# Patient Record
Sex: Female | Born: 1935 | Race: White | Hispanic: No | Smoking: Never smoker
Health system: Southern US, Community
[De-identification: ages and names within clinical notes are randomized; demographics above are authoritative.]

## PROBLEM LIST (undated history)

## (undated) DIAGNOSIS — G629 Polyneuropathy, unspecified: Secondary | ICD-10-CM

## (undated) DIAGNOSIS — R591 Generalized enlarged lymph nodes: Secondary | ICD-10-CM

## (undated) DIAGNOSIS — I639 Cerebral infarction, unspecified: Secondary | ICD-10-CM

## (undated) DIAGNOSIS — N289 Disorder of kidney and ureter, unspecified: Secondary | ICD-10-CM

## (undated) DIAGNOSIS — E119 Type 2 diabetes mellitus without complications: Secondary | ICD-10-CM

## (undated) DIAGNOSIS — D649 Anemia, unspecified: Secondary | ICD-10-CM

## (undated) DIAGNOSIS — I509 Heart failure, unspecified: Secondary | ICD-10-CM

## (undated) DIAGNOSIS — I2510 Atherosclerotic heart disease of native coronary artery without angina pectoris: Secondary | ICD-10-CM

## (undated) HISTORY — PX: TONSILLECTOMY: SUR1361

## (undated) HISTORY — PX: CHOLECYSTECTOMY: SHX55

## (undated) HISTORY — PX: TOTAL HIP ARTHROPLASTY: SHX124

---

## 2018-08-28 ENCOUNTER — Emergency Department (HOSPITAL_COMMUNITY): Payer: Medicare Other

## 2018-08-28 ENCOUNTER — Other Ambulatory Visit: Payer: Self-pay

## 2018-08-28 ENCOUNTER — Inpatient Hospital Stay (HOSPITAL_COMMUNITY)
Admission: EM | Admit: 2018-08-28 | Discharge: 2018-09-02 | DRG: 603 | Disposition: A | Discharge status: Home Health | Payer: Medicare Other | Attending: Family Medicine | Admitting: Family Medicine

## 2018-08-28 ENCOUNTER — Encounter (HOSPITAL_COMMUNITY): Payer: Self-pay | Admitting: *Deleted

## 2018-08-28 DIAGNOSIS — I890 Lymphedema, not elsewhere classified: Secondary | ICD-10-CM | POA: Diagnosis present

## 2018-08-28 DIAGNOSIS — L538 Other specified erythematous conditions: Secondary | ICD-10-CM | POA: Diagnosis not present

## 2018-08-28 DIAGNOSIS — N183 Chronic kidney disease, stage 3 unspecified: Secondary | ICD-10-CM | POA: Diagnosis present

## 2018-08-28 DIAGNOSIS — A09 Infectious gastroenteritis and colitis, unspecified: Secondary | ICD-10-CM | POA: Diagnosis not present

## 2018-08-28 DIAGNOSIS — Z955 Presence of coronary angioplasty implant and graft: Secondary | ICD-10-CM

## 2018-08-28 DIAGNOSIS — Z91040 Latex allergy status: Secondary | ICD-10-CM

## 2018-08-28 DIAGNOSIS — R609 Edema, unspecified: Secondary | ICD-10-CM

## 2018-08-28 DIAGNOSIS — I130 Hypertensive heart and chronic kidney disease with heart failure and stage 1 through stage 4 chronic kidney disease, or unspecified chronic kidney disease: Secondary | ICD-10-CM | POA: Diagnosis present

## 2018-08-28 DIAGNOSIS — E1122 Type 2 diabetes mellitus with diabetic chronic kidney disease: Secondary | ICD-10-CM | POA: Diagnosis present

## 2018-08-28 DIAGNOSIS — I2510 Atherosclerotic heart disease of native coronary artery without angina pectoris: Secondary | ICD-10-CM | POA: Diagnosis present

## 2018-08-28 DIAGNOSIS — I5032 Chronic diastolic (congestive) heart failure: Secondary | ICD-10-CM | POA: Diagnosis present

## 2018-08-28 DIAGNOSIS — M25462 Effusion, left knee: Secondary | ICD-10-CM

## 2018-08-28 DIAGNOSIS — Z66 Do not resuscitate: Secondary | ICD-10-CM | POA: Diagnosis present

## 2018-08-28 DIAGNOSIS — Z96649 Presence of unspecified artificial hip joint: Secondary | ICD-10-CM | POA: Diagnosis present

## 2018-08-28 DIAGNOSIS — Z8673 Personal history of transient ischemic attack (TIA), and cerebral infarction without residual deficits: Secondary | ICD-10-CM

## 2018-08-28 DIAGNOSIS — M7121 Synovial cyst of popliteal space [Baker], right knee: Secondary | ICD-10-CM | POA: Diagnosis present

## 2018-08-28 DIAGNOSIS — E119 Type 2 diabetes mellitus without complications: Secondary | ICD-10-CM

## 2018-08-28 DIAGNOSIS — H9190 Unspecified hearing loss, unspecified ear: Secondary | ICD-10-CM | POA: Diagnosis present

## 2018-08-28 DIAGNOSIS — A0472 Enterocolitis due to Clostridium difficile, not specified as recurrent: Secondary | ICD-10-CM | POA: Diagnosis present

## 2018-08-28 DIAGNOSIS — Z6835 Body mass index (BMI) 35.0-35.9, adult: Secondary | ICD-10-CM

## 2018-08-28 DIAGNOSIS — I252 Old myocardial infarction: Secondary | ICD-10-CM | POA: Diagnosis not present

## 2018-08-28 DIAGNOSIS — Z9181 History of falling: Secondary | ICD-10-CM

## 2018-08-28 DIAGNOSIS — I872 Venous insufficiency (chronic) (peripheral): Secondary | ICD-10-CM | POA: Diagnosis present

## 2018-08-28 DIAGNOSIS — E785 Hyperlipidemia, unspecified: Secondary | ICD-10-CM | POA: Diagnosis present

## 2018-08-28 DIAGNOSIS — L03115 Cellulitis of right lower limb: Principal | ICD-10-CM | POA: Diagnosis present

## 2018-08-28 DIAGNOSIS — E669 Obesity, unspecified: Secondary | ICD-10-CM | POA: Diagnosis present

## 2018-08-28 DIAGNOSIS — R197 Diarrhea, unspecified: Secondary | ICD-10-CM | POA: Diagnosis present

## 2018-08-28 DIAGNOSIS — Z79899 Other long term (current) drug therapy: Secondary | ICD-10-CM

## 2018-08-28 DIAGNOSIS — R42 Dizziness and giddiness: Secondary | ICD-10-CM | POA: Diagnosis present

## 2018-08-28 DIAGNOSIS — I10 Essential (primary) hypertension: Secondary | ICD-10-CM | POA: Diagnosis not present

## 2018-08-28 DIAGNOSIS — R918 Other nonspecific abnormal finding of lung field: Secondary | ICD-10-CM | POA: Diagnosis present

## 2018-08-28 DIAGNOSIS — Z7902 Long term (current) use of antithrombotics/antiplatelets: Secondary | ICD-10-CM

## 2018-08-28 DIAGNOSIS — Z20828 Contact with and (suspected) exposure to other viral communicable diseases: Secondary | ICD-10-CM | POA: Diagnosis present

## 2018-08-28 DIAGNOSIS — M1712 Unilateral primary osteoarthritis, left knee: Secondary | ICD-10-CM | POA: Diagnosis present

## 2018-08-28 DIAGNOSIS — D638 Anemia in other chronic diseases classified elsewhere: Secondary | ICD-10-CM | POA: Diagnosis present

## 2018-08-28 DIAGNOSIS — Z880 Allergy status to penicillin: Secondary | ICD-10-CM | POA: Diagnosis not present

## 2018-08-28 DIAGNOSIS — R509 Fever, unspecified: Secondary | ICD-10-CM

## 2018-08-28 HISTORY — DX: Type 2 diabetes mellitus without complications: E11.9

## 2018-08-28 HISTORY — DX: Generalized enlarged lymph nodes: R59.1

## 2018-08-28 HISTORY — DX: Heart failure, unspecified: I50.9

## 2018-08-28 HISTORY — DX: Cerebral infarction, unspecified: I63.9

## 2018-08-28 HISTORY — DX: Anemia, unspecified: D64.9

## 2018-08-28 HISTORY — DX: Polyneuropathy, unspecified: G62.9

## 2018-08-28 HISTORY — DX: Atherosclerotic heart disease of native coronary artery without angina pectoris: I25.10

## 2018-08-28 HISTORY — DX: Disorder of kidney and ureter, unspecified: N28.9

## 2018-08-28 LAB — URINALYSIS, ROUTINE W REFLEX MICROSCOPIC
Bilirubin Urine: NEGATIVE
Glucose, UA: NEGATIVE mg/dL
Ketones, ur: NEGATIVE mg/dL
Leukocytes,Ua: NEGATIVE
Nitrite: NEGATIVE
Protein, ur: NEGATIVE mg/dL
Specific Gravity, Urine: 1.005 (ref 1.005–1.030)
pH: 6 (ref 5.0–8.0)

## 2018-08-28 LAB — COMPREHENSIVE METABOLIC PANEL
ALT: 24 U/L (ref 0–44)
AST: 32 U/L (ref 15–41)
Albumin: 2.6 g/dL — ABNORMAL LOW (ref 3.5–5.0)
Alkaline Phosphatase: 59 U/L (ref 38–126)
Anion gap: 6 (ref 5–15)
BUN: 16 mg/dL (ref 8–23)
CO2: 28 mmol/L (ref 22–32)
Calcium: 9.1 mg/dL (ref 8.9–10.3)
Chloride: 105 mmol/L (ref 98–111)
Creatinine, Ser: 1.05 mg/dL — ABNORMAL HIGH (ref 0.44–1.00)
GFR calc Af Amer: 56 mL/min — ABNORMAL LOW (ref >60)
GFR calc non Af Amer: 48 mL/min — ABNORMAL LOW (ref >60)
Glucose, Bld: 115 mg/dL — ABNORMAL HIGH (ref 70–99)
Potassium: 4.6 mmol/L (ref 3.5–5.1)
Sodium: 139 mmol/L (ref 135–145)
Total Bilirubin: 1.3 mg/dL — ABNORMAL HIGH (ref 0.3–1.2)
Total Protein: 5.4 g/dL — ABNORMAL LOW (ref 6.5–8.1)

## 2018-08-28 LAB — CBC
HCT: 31.5 % — ABNORMAL LOW (ref 36.0–46.0)
Hemoglobin: 9.7 g/dL — ABNORMAL LOW (ref 12.0–15.0)
MCH: 30.4 pg (ref 26.0–34.0)
MCHC: 30.8 g/dL (ref 30.0–36.0)
MCV: 98.7 fL (ref 80.0–100.0)
Platelets: 227 10*3/uL (ref 150–400)
RBC: 3.19 MIL/uL — ABNORMAL LOW (ref 3.87–5.11)
RDW: 15.2 % (ref 11.5–15.5)
WBC: 8 10*3/uL (ref 4.0–10.5)
nRBC: 0 % (ref 0.0–0.2)

## 2018-08-28 LAB — LACTIC ACID, PLASMA
Lactic Acid, Venous: 1.1 mmol/L (ref 0.5–1.9)
Lactic Acid, Venous: 1.8 mmol/L (ref 0.5–1.9)

## 2018-08-28 LAB — SARS CORONAVIRUS 2 BY RT PCR (HOSPITAL ORDER, PERFORMED IN ~~LOC~~ HOSPITAL LAB): SARS Coronavirus 2: NEGATIVE

## 2018-08-28 LAB — PROCALCITONIN: Procalcitonin: <0.1 ng/mL

## 2018-08-28 LAB — MAGNESIUM: Magnesium: 1.9 mg/dL (ref 1.7–2.4)

## 2018-08-28 MED ORDER — LACTATED RINGERS IV SOLN
INTRAVENOUS | Status: DC
  Administered 2018-08-28: 20:00:00 via INTRAVENOUS

## 2018-08-28 MED ORDER — ONDANSETRON HCL 4 MG PO TABS: 4.0000 mg | ORAL_TABLET | Freq: Four times a day (QID) | ORAL | Status: DC | PRN

## 2018-08-28 MED ORDER — VANCOMYCIN HCL 10 G IV SOLR
2000.0000 mg | Freq: Once | INTRAVENOUS | Status: AC
  Administered 2018-08-28: 15:00:00 2000 mg via INTRAVENOUS
  Filled 2018-08-28: qty 2000

## 2018-08-28 MED ORDER — ATORVASTATIN CALCIUM 40 MG PO TABS
40.0000 mg | ORAL_TABLET | Freq: Every day | ORAL | Status: DC
  Administered 2018-08-28 – 2018-09-01 (×5): 40 mg via ORAL
  Filled 2018-08-28 (×5): qty 1

## 2018-08-28 MED ORDER — CLOPIDOGREL BISULFATE 75 MG PO TABS
75.0000 mg | ORAL_TABLET | Freq: Every day | ORAL | Status: DC
  Administered 2018-08-29 – 2018-09-02 (×5): 75 mg via ORAL
  Filled 2018-08-28 (×5): qty 1

## 2018-08-28 MED ORDER — ONDANSETRON HCL 4 MG/2ML IJ SOLN: 4.0000 mg | Freq: Four times a day (QID) | INTRAMUSCULAR | Status: DC | PRN

## 2018-08-28 MED ORDER — PANTOPRAZOLE SODIUM 40 MG PO TBEC
40.0000 mg | DELAYED_RELEASE_TABLET | Freq: Every day | ORAL | Status: DC
  Administered 2018-08-29 – 2018-09-02 (×5): 40 mg via ORAL
  Filled 2018-08-28 (×5): qty 1

## 2018-08-28 MED ORDER — LEVOFLOXACIN IN D5W 500 MG/100ML IV SOLN
500.0000 mg | INTRAVENOUS | Status: DC
  Administered 2018-08-29: 22:00:00 500 mg via INTRAVENOUS
  Filled 2018-08-28 (×2): qty 100

## 2018-08-28 MED ORDER — METOPROLOL SUCCINATE ER 25 MG PO TB24
12.5000 mg | ORAL_TABLET | Freq: Every day | ORAL | Status: DC
  Administered 2018-08-29 – 2018-09-02 (×5): 12.5 mg via ORAL
  Filled 2018-08-28 (×5): qty 1

## 2018-08-28 MED ORDER — VANCOMYCIN HCL IN DEXTROSE 750-5 MG/150ML-% IV SOLN
750.0000 mg | INTRAVENOUS | Status: DC
  Administered 2018-08-29: 15:00:00 750 mg via INTRAVENOUS
  Filled 2018-08-28 (×2): qty 150

## 2018-08-28 MED ORDER — ACETAMINOPHEN 650 MG RE SUPP: 650.0000 mg | Freq: Four times a day (QID) | RECTAL | Status: DC | PRN

## 2018-08-28 MED ORDER — SODIUM CHLORIDE 0.9 % IV BOLUS
1000.0000 mL | Freq: Once | INTRAVENOUS | Status: AC
  Administered 2018-08-28: 1000 mL via INTRAVENOUS

## 2018-08-28 MED ORDER — ACETAMINOPHEN 325 MG PO TABS
650.0000 mg | ORAL_TABLET | Freq: Four times a day (QID) | ORAL | Status: DC | PRN
  Administered 2018-08-31 – 2018-09-01 (×3): 650 mg via ORAL
  Filled 2018-08-28 (×2): qty 2

## 2018-08-28 MED ORDER — LEVOFLOXACIN IN D5W 750 MG/150ML IV SOLN
750.0000 mg | Freq: Once | INTRAVENOUS | Status: AC
  Administered 2018-08-28: 13:00:00 750 mg via INTRAVENOUS
  Filled 2018-08-28: qty 150

## 2018-08-28 MED ORDER — HEPARIN SODIUM (PORCINE) 5000 UNIT/ML IJ SOLN
5000.0000 [IU] | Freq: Three times a day (TID) | INTRAMUSCULAR | Status: DC
  Administered 2018-08-28 – 2018-09-02 (×14): 5000 [IU] via SUBCUTANEOUS
  Filled 2018-08-28 (×14): qty 1

## 2018-08-28 MED ORDER — SODIUM CHLORIDE 0.9% FLUSH
3.0000 mL | Freq: Two times a day (BID) | INTRAVENOUS | Status: DC
  Administered 2018-08-29 – 2018-09-02 (×8): 3 mL via INTRAVENOUS

## 2018-08-28 MED ORDER — SODIUM CHLORIDE 0.9 % IV BOLUS
500.0000 mL | Freq: Once | INTRAVENOUS | Status: AC
  Administered 2018-08-28: 500 mL via INTRAVENOUS

## 2018-08-28 NOTE — ED Triage Notes (Signed)
Consult order for IV team placed because of Pt poor IV access. Pt needs blood cultures and lactic drawn. Lab tech difficult stick also.

## 2018-08-28 NOTE — ED Notes (Signed)
Portable cxr at bedside

## 2018-08-28 NOTE — Progress Notes (Signed)
Notified provider of need to order fluid bolus.  ?

## 2018-08-28 NOTE — ED Notes (Signed)
ED TO INPATIENT HANDOFF REPORT  ED Nurse Name and Phone #:  Patty  S Name/Age/Gender Gail Page 85 y.o. female Room/Bed: 023C/023C  Code Status   Code Status: DNR  Home/SNF/Other Home Patient oriented to: self, place, time and situation Is this baseline? Yes   Triage Complete: Triage complete  Chief Complaint Blood clot in right leg  Triage Note Pt had a fall on 08/07/18, was admitted for a week at that time. This morning pt's daughter noticed increased redness and swelling to R leg, concerned for a blood clot. Pt reports yesterday morning leg was blue and very swollen. Redness noted at present .   Family member at bedside reports Pt . Has had  Fevers of 101 and above since Friday.  Pt had also had diarrhea for at least 20 days > 10 times a day per Pt. Family member reported stool is green in color.  Daughter at staff desk to report Pt has not eaten breakfast today and has not taken AM meds.  Consult order for IV team placed because of Pt poor IV access. Pt needs blood cultures and lactic drawn. Lab tech difficult stick also.  Family at desk and was asked to return to room for safety and Pt confidentiality .  Blood cultures drawn by EDP  With out k device.  Pt very difficult  Stick for IV's and blood draw. Blood cultures drawn before Iv anti-Bx  given  [facility]   Allergies Allergies  Allergen Reactions  . Penicillins Anaphylaxis    Did it involve swelling of the face/tongue/throat, SOB, or low BP? Yes Did it involve sudden or severe rash/hives, skin peeling, or any reaction on the inside of your mouth or nose? No Did you need to seek medical attention at a hospital or doctor's office? No When did it last happen? when 85 years old If all above answers are "NO", may proceed with cephalosporin use.   . Latex Other (See Comments)    Fluid coming out  . Pork-Derived Products     Pt doe not eat pork    Level of Care/Admitting  Diagnosis ED Disposition    ED Disposition Condition Comment   Admit  [facility] [[id number][facility]  Level of Care: Telemetry Medical [104]  Covid Evaluation: Confirmed COVID Negative  Diagnosis: Cellulitis of leg, right [[id number]][[id number]]  Admitting Physician: Jonah BlueYATES, JENNIFER [[id number]]  Attending Physician: Jonah BlueYATES, JENNIFER [[id number]]  Estimated length of stay: past midnight tomorrow  Certification:: I certify this patient will need inpatient services for at least 2 midnights  PT Class (Do Not Modify): Inpatient [101]  PT Acc Code (Do Not Modify): Private [1]       B Medical/Surgery History Past Medical History:  Diagnosis Date  . Anemia   . CHF (congestive heart failure) (HCC)   . Coronary artery disease   . Diabetes mellitus without complication (HCC)   . Lymphadenopathy   . Neuropathy   . Renal disorder   . Stroke [facility](HCC)    Past Surgical History:  Procedure Laterality Date  . CHOLECYSTECTOMY    . TONSILLECTOMY    . TOTAL HIP ARTHROPLASTY       A IV Location/Drains/Wounds Patient Lines/Drains/Airways Status   Active Line/Drains/Airways    Name:   Placement date:   Placement time:   Site:   Days:   Peripheral IV 08/28/18 Left Forearm   08/28/18    0827    Forearm   less than 1  Intake/Output Last 24 hours  Intake/Output Summary (Last 24 hours) at 08/28/2018 1533 Last data filed at 08/28/2018 1532 Gross per 24 hour  Intake 1150 ml  Output -  Net 1150 ml    Labs/Imaging Results for orders placed or performed during the hospital encounter of 08/28/18 (from the past 48 hour(s))  SARS Coronavirus 2 [facility] order, Performed in [facility] hospital lab) Nasopharyngeal Nasopharyngeal Swab     Status: None   Collection Time: 08/28/18  8:02 AM   Specimen: Nasopharyngeal Swab  Result Value Ref Range   SARS Coronavirus 2 NEGATIVE NEGATIVE    Comment: (NOTE) If result is NEGATIVE SARS-CoV-2 target nucleic acids are NOT DETECTED. The SARS-CoV-2 RNA is  generally detectable in upper and lower  respiratory specimens during the acute phase of infection. The lowest  concentration of SARS-CoV-2 viral copies this assay can detect is 250  copies / mL. A negative result does not preclude SARS-CoV-2 infection  and should not be used as the sole basis for treatment or other  patient management decisions.  A negative result may occur with  improper specimen collection / handling, submission of specimen other  than nasopharyngeal swab, presence of viral mutation(s) within the  areas targeted by this assay, and inadequate number of viral copies  (<250 copies / mL). A negative result must be combined with clinical  observations, patient history, and epidemiological information. If result is POSITIVE SARS-CoV-2 target nucleic acids are DETECTED. The SARS-CoV-2 RNA is generally detectable in upper and lower  respiratory specimens dur ing the acute phase of infection.  Positive  results are indicative of active infection with SARS-CoV-2.  Clinical  correlation with patient history and other diagnostic information is  necessary to determine patient infection status.  Positive results do  not rule out bacterial infection or co-infection with other viruses. If result is PRESUMPTIVE POSTIVE SARS-CoV-2 nucleic acids MAY BE PRESENT.   A presumptive positive result was obtained on the submitted specimen  and confirmed on repeat testing.  While 2019 novel coronavirus  (SARS-CoV-2) nucleic acids may be present in the submitted sample  additional confirmatory testing may be necessary for epidemiological  and / or clinical management purposes  to differentiate between  SARS-CoV-2 and other Sarbecovirus currently known to infect humans.  If clinically indicated additional testing with an alternate test  methodology [phone]) is advised. The SARS-CoV-2 RNA is generally  detectable in upper and lower respiratory sp ecimens during the acute  phase of  infection. The expected result is Negative. Fact Sheet for Patients:  BoilerBrush.com.cy Fact Sheet for Healthcare Providers: https://pope.com/ This test is not yet approved or cleared by the [place] FDA and has been authorized for detection and/or diagnosis of SARS-CoV-2 by FDA under an Emergency Use Authorization (EUA).  This EUA will remain in effect (meaning this test can be used) for the duration of the COVID-19 declaration under Section 564(b)(1) of the Act, 21 U.S.C. section 360bbb-3(b)(1), unless the authorization is terminated or revoked sooner. Performed at [facility], [address], [place], [place] [address]   Comprehensive metabolic panel     Status: Abnormal   Collection Time: 08/28/18  8:30 AM  Result Value Ref Range   Sodium 139 135 - 145 mmol/L   Potassium 4.6 3.5 - 5.1 mmol/L   Chloride 105 98 - 111 mmol/L   CO2 28 22 - 32 mmol/L   Glucose, Bld 115 (H) 70 - 99 mg/dL   BUN 16 8 - 23 mg/dL  Creatinine, Ser 1.05 (H) 0.44 - 1.00 mg/dL   Calcium 9.1 8.9 - 16.110.3 mg/dL   Total Protein 5.4 (L) 6.5 - 8.1 g/dL   Albumin 2.6 (L) 3.5 - 5.0 g/dL   AST 32 15 - 41 U/L   ALT 24 0 - 44 U/L   Alkaline Phosphatase 59 38 - 126 U/L   Total Bilirubin 1.3 (H) 0.3 - 1.2 mg/dL   GFR calc non Af Amer 48 (L) >60 mL/min   GFR calc Af Amer 56 (L) >60 mL/min   Anion gap 6 5 - 15    Comment: Performed at [facility], [address] [place], [place] [address]  CBC     Status: Abnormal   Collection Time: 08/28/18  8:30 AM  Result Value Ref Range   WBC 8.0 4.0 - 10.5 K/uL   RBC 3.19 (L) 3.87 - 5.11 MIL/uL   Hemoglobin 9.7 (L) 12.0 - 15.0 g/dL   HCT 54.031.5 (L) 98.136.0 - 19.146.0 %   MCV 98.7 80.0 - 100.0 fL   MCH 30.4 26.0 - 34.0 pg   MCHC 30.8 30.0 - 36.0 g/dL   RDW 47.815.2 29.511.5 - 62.115.5 %   Platelets 227 150 - 400 K/uL   nRBC 0.0 0.0 - 0.2 %    Comment: Performed at [facility], [address] [place], [place] [address]   Magnesium     Status: None   Collection Time: 08/28/18  8:30 AM  Result Value Ref Range   Magnesium 1.9 1.7 - 2.4 mg/dL    Comment: Performed at [facility], [address] [place], [place] [address]   Dg Chest Port 1 View  Result Date: 08/28/2018 CLINICAL DATA:  Leg pain and swelling. Fever today. Diarrhea for 20 days. Fall on 08/07/2018. EXAM: PORTABLE CHEST 1 VIEW COMPARISON:  None. FINDINGS: Cardiac silhouette is normal in size. No mediastinal or hilar masses or evidence of adenopathy. There is opacity at the left lung base. Milder opacity is noted at the right lung base which is likely due to a combination of atelectasis and contour lobulation of the hemidiaphragm. Remainder of the lungs show prominent bronchovascular markings, but are otherwise clear. Possible small effusions, particularly on the left. No pneumothorax. Skeletal structures are grossly intact. IMPRESSION: 1. Left lung base opacity consistent with pneumonia or atelectasis. A mass is also possible. Probable small associated pleural effusion. Consider follow-up chest CT with contrast for further assessment. 2. Mild atelectasis or, less likely, infiltrate at the right lung base. 3. Lung hyperexpansion consistent with underlying COPD. Electronically Signed   By: Amie Portlandavid  Ormond M.D.   On: 08/28/2018 10:09   Vas [place] Lower Extremity Venous (dvt)  Result Date: 08/28/2018  Lower Venous Study Indications: Swelling.  Risk Factors: Trauma. Limitations: Body habitus and poor ultrasound/tissue interface. Comparison Study: No prior studies. Performing Technologist: Chanda BusingGregory Collins RVT  Examination Guidelines: A complete evaluation includes B-mode imaging, spectral Doppler, color Doppler, and power Doppler as needed of all accessible portions of each vessel. Bilateral testing is considered an integral part of a complete examination. Limited examinations for reoccurring indications may be performed as noted.   +---------+---------------+---------+-----------+----------+-------------------+ RIGHT    CompressibilityPhasicitySpontaneityPropertiesSummary             +---------+---------------+---------+-----------+----------+-------------------+ CFV      Full           Yes      Yes                                      +---------+---------------+---------+-----------+----------+-------------------+  SFJ      Full                                                             +---------+---------------+---------+-----------+----------+-------------------+ FV Prox  Full                                                             +---------+---------------+---------+-----------+----------+-------------------+ FV Mid                  Yes      Yes                                      +---------+---------------+---------+-----------+----------+-------------------+ FV Distal               Yes      Yes                                      +---------+---------------+---------+-----------+----------+-------------------+ PFV      Full                                                             +---------+---------------+---------+-----------+----------+-------------------+ POP      Full           Yes      Yes                                      +---------+---------------+---------+-----------+----------+-------------------+ PTV      Full                                                             +---------+---------------+---------+-----------+----------+-------------------+ PERO                                                  Patency shown with                                                        color doppler       +---------+---------------+---------+-----------+----------+-------------------+   +----+---------------+---------+-----------+----------+-------+ LEFTCompressibilityPhasicitySpontaneityPropertiesSummary  +----+---------------+---------+-----------+----------+-------+ CFV Full           Yes      Yes                          +----+---------------+---------+-----------+----------+-------+  Summary: Right: There is no evidence of deep vein thrombosis in the lower extremity. A cystic structure is found in the popliteal fossa. A fluid collection is noted in the distal thigh. The fluid collection extends from the distal anterior thigh into the distal posterior thigh. Left: No evidence of common femoral vein obstruction.  *See table(s) above for measurements and observations.    Preliminary     Pending Labs Unresulted Labs (From admission, onward)    Start     Ordered   08/29/18 0500  Basic metabolic panel  Tomorrow morning,   R     08/28/18 1521   08/29/18 0500  CBC  Tomorrow morning,   R     08/28/18 1521   08/28/18 1517  Procalcitonin  Once,   STAT     08/28/18 1521   08/28/18 1143  C difficile quick scan w PCR reflex  (C Difficile quick screen w PCR reflex panel)  Once, for 24 hours,   STAT     08/28/18 1143   08/28/18 1143  Urine culture  ONCE - STAT,   STAT     08/28/18 1143   08/28/18 0920  Blood culture (routine x 2)  BLOOD CULTURE X 2,   STAT     08/28/18 0919   08/28/18 0920  Lactic acid, plasma  Now then every 2 hours,   STAT     08/28/18 0919   08/28/18 0801  Urinalysis, Routine w reflex microscopic- may I&O cath if menses  Once,   STAT     08/28/18 0802          Vitals/Pain Today's Vitals   08/28/18 1200 08/28/18 1404 08/28/18 1513 08/28/18 1529  BP:  (!) 150/76 (!) 122/56   Pulse:  71 73   Resp:  16 20   Temp:      TempSrc:      SpO2:   95%   Weight: 94.6 kg     Height: 5\' 4"  (1.626 m)     PainSc:    0-No pain    Isolation Precautions Enteric precautions (UV disinfection)  Medications Medications  vancomycin (VANCOCIN) IVPB 750 mg/150 ml premix (has no administration in time range)  levofloxacin (LEVAQUIN) IVPB 500 mg (has no administration in time  range)  atorvastatin (LIPITOR) tablet 40 mg (has no administration in time range)  metoprolol succinate (TOPROL-XL) 24 hr tablet 12.5 mg (has no administration in time range)  pantoprazole (PROTONIX) EC tablet 40 mg (has no administration in time range)  clopidogrel (PLAVIX) tablet 75 mg (has no administration in time range)  sodium chloride flush (NS) 0.9 % injection 3 mL (has no administration in time range)  lactated ringers infusion (has no administration in time range)  acetaminophen (TYLENOL) tablet 650 mg (has no administration in time range)    Or  acetaminophen (TYLENOL) suppository 650 mg (has no administration in time range)  ondansetron (ZOFRAN) tablet 4 mg (has no administration in time range)    Or  ondansetron (ZOFRAN) injection 4 mg (has no administration in time range)  heparin injection 5,000 Units (has no administration in time range)  sodium chloride 0.9 % bolus 500 mL (0 mLs Intravenous Stopped 08/28/18 1524)  sodium chloride 0.9 % bolus 1,000 mL (1,000 mLs Intravenous New Bag/Given 08/28/18 1528)  vancomycin (VANCOCIN) 2,000 mg in sodium chloride 0.9 % 500 mL IVPB (0 mg Intravenous Stopped 08/28/18 1532)  levofloxacin (LEVAQUIN) IVPB 750 mg (0 mg Intravenous Stopped 08/28/18 1433)    Mobility  walks with device Moderate fall risk   Focused Assessments    R Recommendations: See Admitting Provider Note  Report given to:   Additional Notes:

## 2018-08-28 NOTE — Progress Notes (Signed)
Pharmacy Antibiotic Note  Gail Page is a 85 y.o. female admitted on 08/28/2018 with RLE cellulitis.  Patient reports malodorous diarrhea. Pharmacy has been consulted for vancomycin and levfloxacin dosing. Patient has a PCN allergy of unknown reaction. Pharmacy consulted to evaluate PCN allergy. Unable to confirm allergy in CHL. Spoke with patient's daughter who could not confirm reaction to PCN and patient was a poor historian. Would recommend continuing with levofloxacin at this time and consider a PCN skin test in the future. Afebrile. WBC WNL. Hypotensive. CrCl 42. Patient received one time dose of levofloxacin 750mg  and vancomycin 2000mg  in the ED.   Vancomycin 750 mg IV Q 24 hrs. Goal AUC 400-550. Expected AUC: 491.6 Vd used: 0.5 L/kg SCr used: 1.05   Plan: Start levofloxacin 500mg  IV q24h on 8/3 at 2200 Start vancomycin 750mg  IV q24h on 8/3 at 1430 Monitor serum creatinine, C/S and clinical progression Follow up c. difficile PCR   Height: 5\' 4"  (162.6 cm) Weight: 208 lb 8.9 oz (94.6 kg) IBW/kg (Calculated) : 54.7  Temp (24hrs), Avg:98.5 F (36.9 C), Min:98.5 F (36.9 C), Max:98.5 F (36.9 C)  Recent Labs  Lab 08/28/18 0830  WBC 8.0  CREATININE 1.05*    Estimated Creatinine Clearance: 42.9 mL/min (A) (by C-G formula based on SCr of 1.05 mg/dL (H)).    Allergies  Allergen Reactions  . Latex   . Penicillins     Antimicrobials this admission: Vancomyicn 8/2 >>  Levofloxacin 8/2 >>    Microbiology results: 8/2 BCx: sent 8/2 c diff: sent 8/2 UCx: sent 8/2 covid: negative   Thank you for allowing pharmacy to be a part of this patient's care.  Cristela Felt, PharmD PGY1 Pharmacy Resident [employer]: [phone]  08/28/2018 12:49 PM

## 2018-08-28 NOTE — H&P (Signed)
History and Physical    Gail CitizenYvonne Zabriskie ZOX:[id number] DOB: 04/02/1930 DOA: 08/28/2018  PCP: Venancio PoissonMagan - [place], [place] Consultants:  Earnie LarssonGarimella - cardiology Patient coming from:  Home - [place] in [place] but staying with her daughter since her recent hospitalization; NOK: Daughter, [phone]  Chief Complaint: leg problem  HPI: Gail Page is a 85 y.o. female with medical history significant of CAD (2017); hypertension, Type2 Diabetes, hyperlipidemia , chronic renal insufficiency degenerative joint disease, history of CVA, renal insufficiency presenting with fever.  She was previously admitted after a fall 7/12, discharged on 7/17.  Developed fever with T max 101.5 on 7/19, last fever was on Friday and was 99.7.  Her leg was swollen bruised from the fall and she has chronic B L > R LE lymphedema.  The last 2 days, she has bene complaining of RLE lateral lower pain.  The swelling in her feet would not go down last night and was still swollen this AM.  Daughter was concerned about a DVT vs. Cellulitis.  BP has been well controlled.  The diarrhea started during her last hospitalization.  They saw her PCP and was tested for COVID and placed on a BRAT diet.  She started back on a regular diet.  HHN mentioned concern for C diff - malodorous, green, mucus; has not been tested.  On Lasix when she gains ?2 pounds in a day, none recently.  Has had some vertigo, fatigue.   ED Course:  [facility] for [facility]/weakness.  Xrays negative, no surgery, marked bruising.  2 days of worsening swelling, erythema, pain of RLE.  +weakness.  +fever, T max 101.5 on 7/21, last fever on 7/31.  +diarrhea since last hospitalization - did get abx while hospitalized, has not had stool studies.  +cellulitis.  BP 90/40s, given 500 cc without significant improvement and so ordered 1L.  Labs unremarkable.  Given Vanc/Levaquin for cellulitis coverage.  LE dopplers pending but less suspicion for DVT.  COVID negative.  Review of Systems:  As per HPI; otherwise review of systems reviewed and negative.   Ambulatory Status:  Ambulates with a walker  Past Medical History:  Diagnosis Date  . Anemia   . CHF (congestive heart failure) (HCC)   . Coronary artery disease   . Diabetes mellitus without complication (HCC)   . Lymphadenopathy   . Neuropathy   . Renal disorder   . Stroke [facility](HCC)     Past Surgical History:  Procedure Laterality Date  . CHOLECYSTECTOMY    . TONSILLECTOMY    . TOTAL HIP ARTHROPLASTY      Social History   Socioeconomic History  . Marital status: Divorced    Spouse name: Not on file  . Number of children: Not on file  . Years of education: Not on file  . Highest education level: Not on file  Occupational History  . Occupation: retired  Engineer, productionocial Needs  . Financial resource strain: Not on file  . Food insecurity    Worry: Not on file    Inability: Not on file  . Transportation needs    Medical: Not on file    Non-medical: Not on file  Tobacco Use  . Smoking status: Never Smoker  . Smokeless tobacco: Never Used  Substance and Sexual Activity  . Alcohol use: Not Currently    Frequency: Never    Comment: rare  . Drug use: Never  . Sexual activity: Not on file  Lifestyle  . Physical activity    Days  per week: Not on file    Minutes per session: Not on file  . Stress: Not on file  Relationships  . Social Musician on phone: Not on file    Gets together: Not on file    Attends religious service: Not on file    Active member of club or organization: Not on file    Attends meetings of clubs or organizations: Not on file    Relationship status: Not on file  . Intimate partner violence    Fear of current or ex partner: Not on file    Emotionally abused: Not on file    Physically abused: Not on file    Forced sexual activity: Not on file  Other Topics Concern  . Not on file  Social History Narrative  . Not on file    Allergies  Allergen Reactions  . Penicillins  Anaphylaxis    Did it involve swelling of the face/tongue/throat, SOB, or low BP? Yes Did it involve sudden or severe rash/hives, skin peeling, or any reaction on the inside of your mouth or nose? No Did you need to seek medical attention at a hospital or doctor's office? No When did it last happen? when 85 years old If all above answers are "NO", may proceed with cephalosporin use.   . Latex Other (See Comments)    Fluid coming out  . Pork-Derived Products     Pt does not eat pork. I spoke with patient - she is ok with heparin.    No family history on file.  Prior to Admission medications   Not on File    Physical Exam: Vitals:   08/28/18 1404 08/28/18 1513 08/28/18 1515 08/28/18 1530  BP: (!) 150/76 (!) 122/56 (!) 118/55 (!) 129/54  Pulse: 71 73 75 76  Resp: 16 20 (!) 21 (!) 23  Temp:      TempSrc:      SpO2:  95% 96% 100%  Weight:      Height:         . General:  Appears calm and comfortable and is NAD . Eyes:  PERRL, EOMI, normal lids, iris . ENT:  Hard of hearing, normal lips & tongue, mildly dry mm . Neck:  no LAD, masses or thyromegaly . Cardiovascular:  RRR, no m/r/g. No LE edema.  Marland Kitchen Respiratory:   CTA bilaterally with no wheezes/rales/rhonchi.  Normal respiratory effort. . Abdomen:  soft, NT, ND, NABS . Skin:  Chronic B LE lymphedema but the entire RLE from knee to foot is diffusely erythematous with worsening edema compared to prior.  There may be mild streaking medially above the knee.  No obvious skin wounds.     . Musculoskeletal:  grossly normal tone BUE/BLE, good ROM, no bony abnormality . Lower extremity:  No LE edema.  Limited foot exam with no ulcerations.  2+ distal pulses. Marland Kitchen Psychiatric:  grossly normal mood and affect, speech fluent and appropriate, AOx3 . Neurologic:  CN 2-12 grossly intact, moves all extremities in coordinated fashion    Radiological Exams on Admission: Dg Chest Port 1 View  Result Date: 08/28/2018 CLINICAL DATA:   Leg pain and swelling. Fever today. Diarrhea for 20 days. Fall on 08/07/2018. EXAM: PORTABLE CHEST 1 VIEW COMPARISON:  None. FINDINGS: Cardiac silhouette is normal in size. No mediastinal or hilar masses or evidence of adenopathy. There is opacity at the left lung base. Milder opacity is noted at the right lung base which is likely due  to a combination of atelectasis and contour lobulation of the hemidiaphragm. Remainder of the lungs show prominent bronchovascular markings, but are otherwise clear. Possible small effusions, particularly on the left. No pneumothorax. Skeletal structures are grossly intact. IMPRESSION: 1. Left lung base opacity consistent with pneumonia or atelectasis. A mass is also possible. Probable small associated pleural effusion. Consider follow-up chest CT with contrast for further assessment. 2. Mild atelectasis or, less likely, infiltrate at the right lung base. 3. Lung hyperexpansion consistent with underlying COPD. Electronically Signed   By: Amie Portlandavid  Ormond M.D.   On: 08/28/2018 10:09   Vas Koreas Lower Extremity Venous (dvt)  Result Date: 08/28/2018  Lower Venous Study Indications: Swelling.  Risk Factors: Trauma. Limitations: Body habitus and poor ultrasound/tissue interface. Comparison Study: No prior studies. Performing Technologist: Chanda BusingGregory Collins RVT  Examination Guidelines: A complete evaluation includes B-mode imaging, spectral Doppler, color Doppler, and power Doppler as needed of all accessible portions of each vessel. Bilateral testing is considered an integral part of a complete examination. Limited examinations for reoccurring indications may be performed as noted.  +---------+---------------+---------+-----------+----------+-------------------+ RIGHT    CompressibilityPhasicitySpontaneityPropertiesSummary             +---------+---------------+---------+-----------+----------+-------------------+ CFV      Full           Yes      Yes                                       +---------+---------------+---------+-----------+----------+-------------------+ SFJ      Full                                                             +---------+---------------+---------+-----------+----------+-------------------+ FV Prox  Full                                                             +---------+---------------+---------+-----------+----------+-------------------+ FV Mid                  Yes      Yes                                      +---------+---------------+---------+-----------+----------+-------------------+ FV Distal               Yes      Yes                                      +---------+---------------+---------+-----------+----------+-------------------+ PFV      Full                                                             +---------+---------------+---------+-----------+----------+-------------------+ POP  Full           Yes      Yes                                      +---------+---------------+---------+-----------+----------+-------------------+ PTV      Full                                                             +---------+---------------+---------+-----------+----------+-------------------+ PERO                                                  Patency shown with                                                        color doppler       +---------+---------------+---------+-----------+----------+-------------------+   +----+---------------+---------+-----------+----------+-------+ LEFTCompressibilityPhasicitySpontaneityPropertiesSummary +----+---------------+---------+-----------+----------+-------+ CFV Full           Yes      Yes                          +----+---------------+---------+-----------+----------+-------+     Summary: Right: There is no evidence of deep vein thrombosis in the lower extremity. A cystic structure is found in the popliteal fossa. A fluid collection is  noted in the distal thigh. The fluid collection extends from the distal anterior thigh into the distal posterior thigh. Left: No evidence of common femoral vein obstruction.  *See table(s) above for measurements and observations.    Preliminary     EKG: Independently reviewed.  NSR with rate 70; nonspecific ST changes with no evidence of acute ischemia   Labs on Admission: I have personally reviewed the available labs and imaging studies at the time of the admission.  Pertinent labs:   Glucose 115 BUN 16/Creatinine 1.05/GFR 48 Albumin 2.6 Bili 1.3 WBC 8.0 Hgb 9.7 COVID negative UA with small Hgb, rare bacteria Lactate 1.1, 1.8  Assessment/Plan Principal Problem:   Cellulitis of leg, right Active Problems:   Diarrhea   Opacity of lung on imaging study   Anemia of chronic disease   Chronic diastolic CHF (congestive heart failure) (HCC)   Essential hypertension   CAD (coronary artery disease)   Diabetes mellitus type 2, diet-controlled (HCC)   CKD (chronic kidney disease) stage 3, GFR 30-59 ml/min (HCC)   Cellulitis -Patient with h/o recent fall with significant ecchymosis to RLE, now presenting with erythema to the R lower leg -Careful inspection of the skin does not show skin breaks -Normal WBC count -She was given Vanc and Levaquin in the ER; will continue for now given this plus CXR findings plus anaphylactic PCN allergy -She did have a RLE [place] for DVT and this was negative for DVT; there is an apparent fluid collection in the R thigh, but her symptoms are more concerning in the lower leg and EDP did not feel further imaging  is indicated -For now, will use heparin for DVT prophylaxis and monitor, but if concerns develop she should have evaluation for R thigh hematoma  Diarrhea -Patient with basically all mucus and no stool prior to my evaluation -Daughter reports green, foul-smelling diarrhea since prior hospitalization and reports that she did receive antibiotics at that  time -C diff was ordered by the EDP  LLL Opacity on CXR -She doesn't really have respiratory symptoms or findings on exam -However, CXR performed and shows LLL opacity -She is already on antibiotic coverage because of her cellulitis -Procalcitonin is pending -Consider CT based on clinical course either inpatient or outpatient  Anemia -Baseline 10-11, currently 9.7 -Appears to be stable at this time -Resume FeSO4 at time of d/c  Chronic diastolic CHF -Appears to be compensated at this time, but this may be challenging to predict given her chronic LE lymphedema -No apparent edema on CXR -Continue Plavix, Lipitor, Toprol XL  DM -Diet controlled  Stage 3 CKD -Creatinine at time of last hospital d/c was 1.08 -Currently 1.05 -Will follow  Other -There was discussion about palliative care consultation during her prior hospitalization, as patient has been in declining health Note: This patient has been tested and is negative for the novel coronavirus COVID-19.  DVT prophylaxis: Heparin Code Status:  DNR - confirmed with patient/family Family Communication: Daughter was present throughout evaluation Disposition Plan:  Home once clinically improved Consults called: None  Admission status: Admit - It is my clinical opinion that admission to INPATIENT is reasonable and necessary because of the expectation that this patient will require hospital care that crosses at least 2 midnights to treat this condition based on the medical complexity of the problems presented.  Given the aforementioned information, the predictability of an adverse outcome is felt to be significant.    Jonah BlueJennifer Haizel Gatchell MD [facility]   How to contact the [facility] Attending or Consulting provider 7A - 7P or covering provider during after hours 7P -7A, for this patient?  1. Check the care team in [facility] and look for a) attending/consulting [facility] provider listed and b) the [facility] team listed 2. Log into [email or web address] and  use [facility] universal password to access. If you do not have the password, please contact the hospital operator. 3. Locate the [facility] provider you are looking for under [facility] and page to a number that you can be directly reached. 4. If you still have difficulty reaching the provider, please page the [facility] (Director on Call) for the Hospitalists listed on amion for assistance.   08/28/2018, 6:03 PM

## 2018-08-28 NOTE — ED Triage Notes (Signed)
Family at desk and was asked to return to room for safety and Pt confidentiality .

## 2018-08-28 NOTE — Progress Notes (Signed)
Secure chat with Dr. Billy Fischer regarding need for fluids based off of the sepsis protocol. She stated that she does not feel that giving more fluids would be appropriate at this time. Will continue to monitor protocol for the six hours.

## 2018-08-28 NOTE — Progress Notes (Signed)
Right lower extremity venous duplex has been completed. Preliminary results can be found in CV Proc through chart review.  Results were given to Dr. Billy Fischer.  08/28/18 2:41 PM Gail Page RVT

## 2018-08-28 NOTE — ED Triage Notes (Signed)
Family member at bedside reports Pt . Has had  Fevers of 101 and above since Friday.  Pt had also had diarrhea for at least 20 days > 10 times a day per Pt. Family member reported stool is green in color.

## 2018-08-28 NOTE — ED Triage Notes (Signed)
Pt had a fall on 08/07/18, was admitted for a week at that time. This morning pt's daughter noticed increased redness and swelling to R leg, concerned for a blood clot. Pt reports yesterday morning leg was blue and very swollen. Redness noted at present .

## 2018-08-28 NOTE — ED Triage Notes (Signed)
Blood cultures drawn by EDP  With out k device.  Pt very difficult  Stick for IV's and blood draw. Blood cultures drawn before Iv anti-Bx  given

## 2018-08-28 NOTE — ED Triage Notes (Signed)
Daughter at staff desk to report Pt has not eaten breakfast today and has not taken AM meds.

## 2018-08-28 NOTE — ED Triage Notes (Signed)
PT up to [facility] with assistance.

## 2018-08-28 NOTE — ED Provider Notes (Addendum)
[facility] Provider Note   CSN: [id number] Arrival date & time: 08/28/18  [id number]    History   Chief Complaint Chief Complaint  Patient presents with  . Leg Pain    HPI Lynford CitizenYvonne Page is a 85 y.o. female, Hx Diastolic CHF, DM, Lymphadema, HTN, CAD hx MI 2017, CKD III, CVA (slef-reported), OA, MVP.  She presents with complaints of RLE edema, redness, and pain, worsening in the past 2 days.  Her daughter is present and provides most of history as patient is hard of hearing.  She was [facility] s/p fall and weakness, during which she fell on her right leg, and had a large amount of ecchymoses.  In the last two days, she has had worsening swelling and erythema in her right lower extremity.  No chest pain, has had some DOE that has been stable since discharge from hospital. Rates RLE pain 4/10.  [facility], no fractures or surgeries during hospitalization.  Did received 2 blood transfusions during hospitalization.  Also notes green watery, malodorous diarrhea since discharge from hospital.  Fevers since hospitalization as well, with Tmax 101.5 on 7/21, most recent fever was 7/31.  Endorses occasional abdominal cramping.  [facility]  Tried BRAT diet without improvement. Daughter reports PCP did not seem concerned about diarrhea and did not perform stool studies.  Overall, in last few weeks, has been feeling weak and dizzy with standing.  Daughter decreased her home Metoprolol from 25 to 12.5 and has not been giving her prn Lasix.   Past Medical History:  Diagnosis Date  . Anemia   . CHF (congestive heart failure) (HCC)   . Coronary artery disease   . Diabetes mellitus without complication (HCC)   . Lymphadenopathy   . Neuropathy   . Renal disorder   . Stroke [facility](HCC)     There are no active problems to display for this patient.   Past Surgical History:   Procedure Laterality Date  . CHOLECYSTECTOMY    . TONSILLECTOMY    . TOTAL HIP ARTHROPLASTY       OB History   No obstetric history on file.      Home Medications    Prior to Admission medications   Not on File   Atorvastain 40mg  QHS Calcium 500 Vit D3 1000  plavix 75 Iron (now holding) flonase prn Lasix 20mg  prn (2 lbs in 1 day) Metoprolol XL 25mg  QHS (now 12.5) Nitro prn Vitamin B  Family History No family history on file.  Social History Social History   Tobacco Use  . Smoking status: Never Smoker  . Smokeless tobacco: Never Used  Substance Use Topics  . Alcohol use: Not on file  . Drug use: Never     Allergies   Latex and Penicillins Pork Products  Review of Systems Review of Systems  Constitutional: Positive for activity change, fatigue and fever. Negative for appetite change.  HENT: Negative for rhinorrhea and sore throat.   Eyes: Negative for visual disturbance.  Respiratory: Positive for shortness of breath (on exertion).   Cardiovascular: Positive for leg swelling. Negative for chest pain and palpitations.  Gastrointestinal: Positive for abdominal pain (cramping) and diarrhea. Negative for blood in stool and vomiting.  Genitourinary: Negative for dysuria.  Musculoskeletal: Positive for arthralgias (chronic arthritis).  Skin:       Erythema of RLE  Neurological: Positive for dizziness. Negative for numbness.  Physical Exam Updated Vital Signs BP (!) 94/54   Pulse 68   Temp 98.5 F (36.9 C) (Oral)   Resp (!) 27   Ht 5\' 4"  (1.626 m)   Wt 94.6 kg   SpO2 94%   BMI 35.80 kg/m   Physical Exam Constitutional:      General: She is not in acute distress.    Appearance: She is obese.  HENT:     Head: Normocephalic and atraumatic.  Cardiovascular:     Rate and Rhythm: Normal rate and regular rhythm.     Pulses: Normal pulses.  Pulmonary:     Effort: Pulmonary effort is normal. No respiratory distress.     Breath sounds: Normal  breath sounds. No wheezing, rhonchi or rales.  Abdominal:     General: Bowel sounds are normal. There is no distension.     Palpations: Abdomen is soft.     Tenderness: There is abdominal tenderness (mild diffuse). There is no guarding or rebound.  Musculoskeletal:        General: Swelling (effusion noted caudad to right knee) present.     Right lower leg: Edema (1-2+ pitting edema) present.     Left lower leg: No edema.  Skin:    General: Skin is warm and dry.     Findings: Erythema (RLE) present. No lesion.     Comments: RLE with increased warmth  Neurological:     Mental Status: She is alert and oriented to person, place, and time.     Sensory: No sensory deficit.  Psychiatric:        Mood and Affect: Mood normal.        Behavior: Behavior normal.        ED Treatments / Results  Labs (all labs ordered are listed, but only abnormal results are displayed) Labs Reviewed  COMPREHENSIVE METABOLIC PANEL - Abnormal; Notable for the following components:      Result Value   Glucose, Bld 115 (*)    Creatinine, Ser 1.05 (*)    Total Protein 5.4 (*)    Albumin 2.6 (*)    Total Bilirubin 1.3 (*)    GFR calc non Af Amer 48 (*)    GFR calc Af Amer 56 (*)    All other components within normal limits  CBC - Abnormal; Notable for the following components:   RBC 3.19 (*)    Hemoglobin 9.7 (*)    HCT 31.5 (*)    All other components within normal limits  SARS CORONAVIRUS 2 (HOSPITAL ORDER, [place] [facility])  CULTURE, BLOOD (ROUTINE X 2)  CULTURE, BLOOD (ROUTINE X 2)  C DIFFICILE QUICK SCREEN W PCR REFLEX  URINE CULTURE  MAGNESIUM  URINALYSIS, ROUTINE W REFLEX MICROSCOPIC  LACTIC ACID, PLASMA  LACTIC ACID, PLASMA    EKG None  Radiology Dg Chest Port 1 View  Result Date: 08/28/2018 CLINICAL DATA:  Leg pain and swelling. Fever today. Diarrhea for 20 days. Fall on 08/07/2018. EXAM: PORTABLE CHEST 1 VIEW COMPARISON:  None. FINDINGS: Cardiac silhouette is  normal in size. No mediastinal or hilar masses or evidence of adenopathy. There is opacity at the left lung base. Milder opacity is noted at the right lung base which is likely due to a combination of atelectasis and contour lobulation of the hemidiaphragm. Remainder of the lungs show prominent bronchovascular markings, but are otherwise clear. Possible small effusions, particularly on the left. No pneumothorax. Skeletal structures are grossly intact. IMPRESSION: 1. Left lung base  opacity consistent with pneumonia or atelectasis. A mass is also possible. Probable small associated pleural effusion. Consider follow-up chest CT with contrast for further assessment. 2. Mild atelectasis or, less likely, infiltrate at the right lung base. 3. Lung hyperexpansion consistent with underlying COPD. Electronically Signed   By: Amie Portlandavid  Ormond M.D.   On: 08/28/2018 10:09    Procedures Procedures (including critical care time)  Medications Ordered in ED Medications  sodium chloride 0.9 % bolus 1,000 mL (has no administration in time range)  vancomycin (VANCOCIN) 2,000 mg in sodium chloride 0.9 % 500 mL IVPB (has no administration in time range)  levofloxacin (LEVAQUIN) IVPB 750 mg (750 mg Intravenous New Bag/Given 08/28/18 1303)  sodium chloride 0.9 % bolus 500 mL (500 mLs Intravenous New Bag/Given 08/28/18 1054)     Initial Impression / Assessment and Plan / ED Course  I have reviewed the triage vital signs and the nursing notes.  Pertinent labs & imaging results that were available during my care of the patient were reviewed by me and considered in my medical decision making (see chart for details).   85 yo female with history of DM, CAD, HTN, CKD, who presents with RLE erythema, edema, and fevers.  Concern for RLE DVT given RLE swelling and erythema in the setting of recent hospitalization and decreased activity, could also consider RLE cellulitis if dopplers negative.  No CP or SOB, with stable vitals,  therefore doubt PE.  Will obtain dopplers to rule out.  Fever of unknown source with some diarrhea, doubt C diff.  Will obtain CMP, CBC, Mag, UA, CXR, blood cultures, lactic acid to assess for source of infection.  BMP with Creatinine at baseline per chart review.  Anemia with Hgb at 9.7.  COVID negative.  CXR reviewed with possible LLL opacity.  BP has been soft, currently 98/45.  Order 500cc NS bolus and continue to monitor.  Patient and daughter updated on results and current plan.    C diff ordered.  BP remains low, 94/54.  Will add 1L NS bolus.  Given increased warmth and erythema of RLE in the setting of fevers will start broad spectrum antibiotics for cellulitis.  She remains afebrile with stable HR, intermittent tachypnea.  Planning for US guided IV insertion to obtain lactic acid and blood cultures.  Lactic acid and blood cultures obtained.  Patient to have in and out cath for UA/Cx.  Will consult hospitalist for admission given patient's history of fever, mild hypotension, and likely cellulitis.  LE dopplers not yet completed.  Spoke with Dr. Ophelia CharterYates of [facility] who agreed to accept patient for admission and proceed with further treatment and follow up.    Final Clinical Impressions(s) / ED Diagnoses   Final diagnoses:  Fever  Cellulitis of right lower extremity    ED Discharge Orders    None       Unknown JimMeccariello, Bailey J, DO 08/28/18 1315    Meccariello, Solmon IceBailey J, DO 08/28/18 1338    Alvira MondaySchlossman, Erin, MD 08/29/18 1056

## 2018-08-28 NOTE — ED Notes (Signed)
IV team refused to draw labs.  Phlebotomy states pt was a hard stick and they are unable to collect BC and Lactic.   EDP aware.

## 2018-08-29 LAB — BASIC METABOLIC PANEL
Anion gap: 4 — ABNORMAL LOW (ref 5–15)
BUN: 14 mg/dL (ref 8–23)
CO2: 27 mmol/L (ref 22–32)
Calcium: 8.3 mg/dL — ABNORMAL LOW (ref 8.9–10.3)
Chloride: 110 mmol/L (ref 98–111)
Creatinine, Ser: 1.04 mg/dL — ABNORMAL HIGH (ref 0.44–1.00)
GFR calc Af Amer: 56 mL/min — ABNORMAL LOW (ref >60)
GFR calc non Af Amer: 49 mL/min — ABNORMAL LOW (ref >60)
Glucose, Bld: 100 mg/dL — ABNORMAL HIGH (ref 70–99)
Potassium: 4.5 mmol/L (ref 3.5–5.1)
Sodium: 141 mmol/L (ref 135–145)

## 2018-08-29 LAB — BLOOD CULTURE ID PANEL (REFLEXED)

## 2018-08-29 LAB — CBC
HCT: 26.5 % — ABNORMAL LOW (ref 36.0–46.0)
Hemoglobin: 8.4 g/dL — ABNORMAL LOW (ref 12.0–15.0)
MCH: 30.3 pg (ref 26.0–34.0)
MCHC: 31.7 g/dL (ref 30.0–36.0)
MCV: 95.7 fL (ref 80.0–100.0)
Platelets: 204 10*3/uL (ref 150–400)
RBC: 2.77 MIL/uL — ABNORMAL LOW (ref 3.87–5.11)
RDW: 15 % (ref 11.5–15.5)
WBC: 7.9 10*3/uL (ref 4.0–10.5)
nRBC: 0 % (ref 0.0–0.2)

## 2018-08-29 LAB — C DIFFICILE QUICK SCREEN W PCR REFLEX
C Diff antigen: POSITIVE — AB
C Diff interpretation: DETECTED
C Diff toxin: POSITIVE — AB

## 2018-08-29 NOTE — Progress Notes (Signed)
Pt was switched to low bed with fall matts due to being a high fall risk pt per qualifications of being greater than 83 years old and having a fall recently. Daughter requested for pt to be switched back to standard hospital bed due to frequency that alarm goes off - RN changed setting to  Lowest intensity. Daughter still requested bed change despite hospital policy. RN notified her that she would discuss request with director of floor. RN spoke to [employer]. She verbalized that she will follow up and speak with daughter. In mean time, pt will stay on low bed until further corrected. RN will continue to monitor pt.

## 2018-08-29 NOTE — Progress Notes (Signed)
PHARMACY - PHYSICIAN COMMUNICATION CRITICAL VALUE ALERT - BLOOD CULTURE IDENTIFICATION (BCID)  Tyresha Fede is an 85 y.o. female who presented to [facility] on 08/28/2018 with a chief complaint of right lower extremity cellulitis.   Assessment:  85 year old female admitted with right lower extremity cellulitis. LLL opacity on chest x ray. Currently being treated with vancomycin and levaquin due to a penicillin allergy that was unable to be clarified. Now with coag negative staph in 1/3 blood cultures. This likely represents contamination.   Name of physician (or Provider) Contacted: D[place]al  Current antibiotics: Vanc/Levaquin  Changes to prescribed antibiotics recommended:  None based on this culture Consider D/C levofloxacin if no concern for pneumonia   Results for orders placed or performed during the hospital encounter of 08/28/18  Blood Culture ID Panel (Reflexed) (Collected: 08/28/2018 12:33 PM)  Result Value Ref Range   Enterococcus species NOT DETECTED NOT DETECTED   Listeria monocytogenes NOT DETECTED NOT DETECTED   Staphylococcus species DETECTED (A) NOT DETECTED   Staphylococcus aureus (BCID) NOT DETECTED NOT DETECTED   Methicillin resistance NOT DETECTED NOT DETECTED   Streptococcus species NOT DETECTED NOT DETECTED   Streptococcus agalactiae NOT DETECTED NOT DETECTED   Streptococcus pneumoniae NOT DETECTED NOT DETECTED   Streptococcus pyogenes NOT DETECTED NOT DETECTED   Acinetobacter baumannii NOT DETECTED NOT DETECTED   Enterobacteriaceae species NOT DETECTED NOT DETECTED   Enterobacter cloacae complex NOT DETECTED NOT DETECTED   Escherichia coli NOT DETECTED NOT DETECTED   Klebsiella oxytoca NOT DETECTED NOT DETECTED   Klebsiella pneumoniae NOT DETECTED NOT DETECTED   Proteus species NOT DETECTED NOT DETECTED   Serratia marcescens NOT DETECTED NOT DETECTED   Haemophilus influenzae NOT DETECTED NOT DETECTED   Neisseria meningitidis NOT DETECTED NOT DETECTED   Pseudomonas aeruginosa NOT DETECTED NOT DETECTED   Candida albicans NOT DETECTED NOT DETECTED   Candida glabrata NOT DETECTED NOT DETECTED   Candida krusei NOT DETECTED NOT DETECTED   Candida parapsilosis NOT DETECTED NOT DETECTED   Candida tropicalis NOT DETECTED NOT DETECTED    Jimmy Footman, PharmD, BCPS, BCIDP Infectious Diseases Clinical Pharmacist Phone: [phone] 08/29/2018  11:34 AM

## 2018-08-29 NOTE — Plan of Care (Signed)

## 2018-08-29 NOTE — Progress Notes (Signed)
PROGRESS NOTE  Gail Page EAV:[id number] DOB: 04/05/1930 DOA: 08/28/2018 PCP: System, Pcp Not In   LOS: 1 day   Brief narrative: Patient is a 85 y.o. female with medical history significant of CAD (2017); hypertension, Type2 Diabetes, hyperlipidemia , chronic renal insufficiency degenerative joint disease, history of CVA, chronic venous insufficiency presenting with fever.   [facility]) after a fall.  X-rays were negative.  She had generalized weakness and was ultimately discharged to home. Patient has chronic bilateral lymphedema and has bruises secondary to fall.  She has been complaining of right lower extremity pain for last 2 days.  Swelling and redness is worsening.  Daughter was concerned about cellulitis versus DVT and patient was brought to the ED. Additionally, patient is also been having diarrhea for more than 2 weeks now starting since her last hospitalization.  She got antibiotics while in the hospital. In the ED, no fever, hemodynamically stable. Blood culture collected.  IV vancomycin and IV Levaquin was started. Patient was admitted to the hospital for cellulitis.  Subjective: Patient was seen and examined this morning.  Pleasant elderly Caucasian female.  Lying down in bed.  Not in distress.  She states that she lives by herself but lately has been living with her daughter.  Assessment/Plan:  Principal Problem:   Cellulitis of leg, right Active Problems:   Diarrhea   Opacity of lung on imaging study   Anemia of chronic disease   Chronic diastolic CHF (congestive heart failure) (HCC)   Essential hypertension   CAD (coronary artery disease)   Diabetes mellitus type 2, diet-controlled (HCC)   CKD (chronic kidney disease) stage 3, GFR 30-59 ml/min (HCC)  Right lower extremity cellulitis In the setting of chronic bilateral lymphedema and recurrent fall. -Patient with h/o recent fall with significant ecchymosis to RLE, now  presenting with erythema to the R lower leg -Careful inspection of the skin does not show skin breaks -Normal WBC count -She was started on IV vancomycin and IV Levaquin on admission.  Continue the same for now. -Right lower extremity ultrasound negative for DVT  Diarrhea -Daughter reports green, foul-smelling diarrhea since prior hospitalization and reports that she did receive antibiotics at that time -C diff pending.  Monitor diarrhea.  LLL Opacity on CXR -She doesn't really have respiratory symptoms or findings on exam -However, CXR performed and shows LLL opacity -She is already on antibiotic coverage because of her cellulitis -Procalcitonin level is low at 0.10. -Monitor respiratory symptoms.  Anemia -Baseline 10-11, currently 8.4 -On iron tablets at home.  Chronic diastolic CHF -Appears to be compensated at this time, but this may be challenging to predict given her chronic LE lymphedema -No apparent edema on CXR -Continue Plavix, Lipitor, Toprol XL.  Lasix remains on hold. -Seems adequately hydrated.  Stop IV fluids today.  DM -Diet controlled. A1c  Stage 3 CKD -Creatinine at time of last hospital d/c was 1.08 -Currently 1.04  Right thigh cyst  -A cystic structure is found in the popliteal fossa. A fluid collection is noted in the distal thigh. The fluid collection extends from the distal anterior thigh into the distal  posterior thigh.  DVT prophylaxis: Heparin Code Status:  DNR - confirmed with patient/family Family Communication: Daughter was present throughout evaluation Disposition Plan:  Home once clinically improved Consults called: None   Body mass index is 35.8 kg/m. Mobility: Encouraged ambulation.  PT evaluation ordered. Diet: Cardiac diet DVT prophylaxis:  Heparin subcu Code Status:  Code Status: DNR  Family Communication:  Expected Discharge:  Pending PT evaluation  Consultants:    Procedures:    Antimicrobials:   Anti-infectives (From admission, onward)   Start     Dose/Rate Route Frequency Ordered Stop   08/29/18 2200  levofloxacin (LEVAQUIN) IVPB 500 mg     500 mg 100 mL/hr over 60 Minutes Intravenous Every 24 hours 08/28/18 1332     08/29/18 1430  vancomycin (VANCOCIN) IVPB 750 mg/150 ml premix     750 mg 150 mL/hr over 60 Minutes Intravenous Every 24 hours 08/28/18 1332     08/28/18 1300  vancomycin (VANCOCIN) 2,000 mg in sodium chloride 0.9 % 500 mL IVPB     2,000 mg 250 mL/hr over 120 Minutes Intravenous  Once 08/28/18 1143 08/28/18 1532   08/28/18 1200  levofloxacin (LEVAQUIN) IVPB 750 mg     750 mg 100 mL/hr over 90 Minutes Intravenous  Once 08/28/18 1143 08/28/18 1433      Infusions:  . levofloxacin (LEVAQUIN) IV    . vancomycin      Scheduled Meds: . atorvastatin  40 mg Oral QHS  . clopidogrel  75 mg Oral Daily  . heparin  5,000 Units Subcutaneous Q8H  . metoprolol succinate  12.5 mg Oral Daily  . pantoprazole  40 mg Oral Daily  . sodium chloride flush  3 mL Intravenous Q12H    PRN meds: acetaminophen **OR** acetaminophen, ondansetron **OR** ondansetron (ZOFRAN) IV   Objective: Vitals:   08/29/18 0816 08/29/18 1111  BP: (!) 117/55 (!) 123/45  Pulse: 84 78  Resp: 16 18  Temp: 97.9 F (36.6 C) 99.1 F (37.3 C)  SpO2: 95% 96%    Intake/Output Summary (Last 24 hours) at 08/29/2018 1124 Last data filed at 08/29/2018 0500 Gross per 24 hour  Intake 2890 ml  Output -  Net 2890 ml   Filed Weights   08/28/18 1200  Weight: 94.6 kg   Weight change:  Body mass index is 35.8 kg/m.   Physical Exam: General exam: Appears calm and comfortable.  Skin: No rashes, lesions or ulcers. HEENT: Atraumatic, normocephalic, supple neck, no obvious bleeding Lungs: Clear to auscultate bilaterally CVS: Regular rate and rhythm, no murmur GI/Abd soft, nontender, nondistended, also present CNS: Alert, awake, oriented x3.  Hard of hearing.  Slow to respond. Psychiatry: Mood  appropriate Extremities: Bilateral lower extremity lymphedema.  Right more swollen than left.  Redness extends up to the mid leg.  Data Review: I have personally reviewed the laboratory data and studies available.  Recent Labs  Lab 08/28/18 0830 08/29/18 0245  WBC 8.0 7.9  HGB 9.7* 8.4*  HCT 31.5* 26.5*  MCV 98.7 95.7  PLT 227 204   Recent Labs  Lab 08/28/18 0830 08/29/18 0245  NA 139 141  K 4.6 4.5  CL 105 110  CO2 28 27  GLUCOSE 115* 100*  BUN 16 14  CREATININE 1.05* 1.04*  CALCIUM 9.1 8.3*  MG 1.9  --     Terrilee Croak, MD  [facility] 08/29/2018

## 2018-08-29 NOTE — Evaluation (Signed)
Physical Therapy Evaluation Patient Details Name: Gail Page MRN: [id number] DOB: 07/04/1930 Today's Date: 08/29/2018   History of Present Illness  Patient is an 85 year old female admitted with R LE cellulitis. PMH to include:anemia, CHF, CAD, DM, CKD. She was recently here after fall.  Clinical Impression  Patient received in bed, pleasant, agrees to PT evaluation. Patient requires min assist with bed mobility, min guard for transfers, ambulated in room with RW 30 feet. Slightly impulsive, requiring cues to slow dawn or wait for me to assist with managing IV line. R LE warm , red and swollen, reports mild pain. Patient requires max +2 assist to slide up in bed while in supine. Patient will benefit from continued skilled PT while here to improve strength, endurance and functional independence for safe return home with daughter.      Follow Up Recommendations Home health PT    Equipment Recommendations  None recommended by PT    Recommendations for Other Services       Precautions / Restrictions Precautions Precautions: Fall Restrictions Weight Bearing Restrictions: No      Mobility  Bed Mobility Overal bed mobility: Needs Assistance             General bed mobility comments: requires assistance from bed rails, max assist +2 to scoot up in bed in supine, min assist bringing LEs back up onto bed.  Transfers Overall transfer level: Needs assistance Equipment used: Rolling walker (2 wheeled) Transfers: Sit to/from Stand Sit to Stand: Min guard            Ambulation/Gait Ambulation/Gait assistance: Min guard Gait Distance (Feet): 30 Feet Assistive device: Rolling walker (2 wheeled) Gait Pattern/deviations: Step-through pattern     General Gait Details: no unsteadiness or LOB noted. Patient slightly impulsive requiring cues for safety with lines.  Stairs            Wheelchair Mobility    Modified Rankin (Stroke Patients Only)       Balance Overall  balance assessment: Needs assistance Sitting-balance support: No upper extremity supported;Feet supported Sitting balance-Leahy Scale: Good     Standing balance support: Bilateral upper extremity supported Standing balance-Leahy Scale: Fair                               Pertinent Vitals/Pain Pain Assessment: Faces Faces Pain Scale: Hurts a little bit Pain Location: R LE Pain Descriptors / Indicators: Discomfort;Tightness Pain Intervention(s): Monitored during session    Home Living Family/patient expects to be discharged to:: Private residence Living Arrangements: Children Available Help at Discharge: Family;Available 24 hours/day Type of Home: House Home Access: Stairs to enter Entrance Stairs-Rails: Psychiatric nurse of Steps: 4 Home Layout: One level Home Equipment: Environmental consultant - 2 wheels      Prior Function Level of Independence: Needs assistance   Gait / Transfers Assistance Needed: patient ambulates with RW at baseline, requires assistance on steps  ADL's / Homemaking Assistance Needed: daughter currently assisting        Hand Dominance        Extremity/Trunk Assessment   Upper Extremity Assessment Upper Extremity Assessment: Generalized weakness    Lower Extremity Assessment Lower Extremity Assessment: Generalized weakness    Cervical / Trunk Assessment Cervical / Trunk Assessment: Normal  Communication   Communication: No difficulties  Cognition Arousal/Alertness: Awake/alert Behavior During Therapy: WFL for tasks assessed/performed Overall Cognitive Status: Within Functional Limits for tasks assessed  General Comments      Exercises     Assessment/Plan    PT Assessment Patient needs continued PT services  PT Problem List Decreased strength;Decreased mobility;Decreased safety awareness;Decreased activity tolerance;Pain       PT Treatment Interventions  Therapeutic activities;Gait training;Therapeutic exercise;Functional mobility training;Stair training;Patient/family education    PT Goals (Current goals can be found in the Care Plan section)  Acute Rehab PT Goals Patient Stated Goal: to return home with daughter, get better PT Goal Formulation: With patient Time For Goal Achievement: 09/03/18 Potential to Achieve Goals: Good    Frequency Min 3X/week   Barriers to discharge        Co-evaluation               AM-PAC PT "6 Clicks" Mobility  Outcome Measure Help needed turning from your back to your side while in a flat bed without using bedrails?: A Little Help needed moving from lying on your back to sitting on the side of a flat bed without using bedrails?: A Little Help needed moving to and from a bed to a chair (including a wheelchair)?: A Little Help needed standing up from a chair using your arms (e.g., wheelchair or bedside chair)?: A Little Help needed to walk in hospital room?: A Little Help needed climbing 3-5 steps with a railing? : A Lot 6 Click Score: 17    End of Session Equipment Utilized During Treatment: Gait belt Activity Tolerance: Patient tolerated treatment well Patient left: in bed;with bed alarm set;with call bell/phone within reach Nurse Communication: Mobility status PT Visit Diagnosis: Muscle weakness (generalized) (M62.81);Difficulty in walking, not elsewhere classified (R26.2);Pain;History of falling (Z91.81) Pain - Right/Left: Right Pain - part of body: Leg    Time: 1455-1520 PT Time Calculation (min) (ACUTE ONLY): 25 min   Charges:   PT Evaluation $PT Eval Moderate Complexity: 1 Mod PT Treatments $Gait Training: 8-22 mins        Magdalyn Arenivas, PT, GCS 08/29/18,4:18 PM

## 2018-08-30 LAB — CULTURE, BLOOD (ROUTINE X 2): Special Requests: ADEQUATE

## 2018-08-30 LAB — URINE CULTURE: Culture: >=100000 — AB

## 2018-08-30 MED ORDER — DOXYCYCLINE HYCLATE 100 MG PO TABS
100.0000 mg | ORAL_TABLET | Freq: Two times a day (BID) | ORAL | Status: DC
  Administered 2018-08-30 – 2018-09-02 (×6): 100 mg via ORAL
  Filled 2018-08-30 (×6): qty 1

## 2018-08-30 MED ORDER — VANCOMYCIN 50 MG/ML ORAL SOLUTION
125.0000 mg | Freq: Four times a day (QID) | ORAL | Status: DC
  Administered 2018-08-30 – 2018-09-02 (×13): 125 mg via ORAL
  Filled 2018-08-30 (×18): qty 2.5

## 2018-08-30 NOTE — Progress Notes (Signed)
RN verified with Mal Amabile RN this morning if pt could be switched from low bed back to standard fall bed with fall mats. She verbalized that this could occur, but to remind pt and family that this is against hospital safety policy. RN did so - pt's daughter verbalized understanding. RN updated Dahal MD of pt's family decision he stated his recommendation would be to stay with low bed. RN reviewed this with pt's daughter and pt - they verbalized understanding but stated this still wanted standard hospital bed due to it being more comfortable for pt. Pt's daughter and pt refused low bed. RN updated charge nurse Location manager of situation.  Beds were switched out but fall mats were kept in place along with other high fall risk safety measures.  RN will continue to monitor pt.

## 2018-08-30 NOTE — Progress Notes (Addendum)
PROGRESS NOTE  Gail Page NUU:[id number] DOB: 09/07/1930 DOA: 08/28/2018 PCP: System, Pcp Not In   LOS: 2 days   Brief narrative: Patientis a 85 y.o.femalewith medical history significant ofCAD (2017);hypertension, Type2 Diabetes, hyperlipidemia , chronic renal insufficiency, degenerative joint disease, history of CVA, chronic venous insufficiencypresenting with fever.  According to patient's daughter, patient was recently hospitalized at [facility] (7/12-7/17) after a fall.  X-rays were negative.  She had generalized weakness and was hypotensive.  Hemoglobin level was low close to 7.  She received 2 units of PRBCs.  Also received iron infusion and vitamin B12.  Per daughter, colonoscopy was planned but not done because patient was on Plavix.  She was ultimately discharged to home. Patient also started to have diarrhea while in the hospital and continued to the discharge till this presentation.  Daughter states patient got broad-spectrum antibiotics for suspected UTI in the hospital. Patient has chronic bilateral lymphedema and has bruises secondary to fall.  She has been complaining of right lower extremity pain for last 2 days.  Swelling and redness is worsening.  Daughter was concerned about cellulitis versus DVT and patient was brought to the ED.  In the ED, no fever, hemodynamically stable. Blood culture collected.  IV vancomycin and IV Levaquin were started. Patient was admitted to the hospital for cellulitis.  Subjective: Patient was seen and examined this morning. Pleasant elderly Caucasian female.  Propped up in bed.  Not in distress. Had 3-4 episodes of loose bowel movement last night. Apparently C. difficile test result came back positive last night.  I started her on oral vancomycin this morning.  Assessment/Plan:  Principal Problem:   Cellulitis of leg, right Active Problems:   Diarrhea   Opacity of lung on imaging study   Anemia of chronic disease   Chronic diastolic  CHF (congestive heart failure) (HCC)   Essential hypertension   CAD (coronary artery disease)   Diabetes mellitus type 2, diet-controlled (HCC)   CKD (chronic kidney disease) stage 3, GFR 30-59 ml/min (HCC)  Right lower extremity cellulitis In the setting of chronic bilateral lymphedema and recurrent fall. -Patient with h/orecent fall with significant ecchymosis to RLE, now presentingwith erythema tothe R lower leg -Careful inspection of the skin does not show skin breaks -Normal WBC count -She was started on IV vancomycin and IV Levaquin on admission. She is not septic.  Cellulitis is improving.  I switched her to oral doxycycline.  -Right lower extremity ultrasound negative for DVT  C. difficile diarrhea -Daughter reports green, foul-smelling diarrhea since prior hospitalization and reports that she did receive antibiotics at that time -C. difficile assay is positive. -Start the patient on oral vancomycin for a planned course of 2 weeks.  LLL Opacity on CXR -She doesn't really have respiratory symptoms or findings on exam -However, CXR performed and shows LLL opacity -She is already on antibiotic coverage because of her cellulitis -Procalcitonin level is low at 0.10. -Monitor respiratory symptoms.  Anemia -Baseline 10-11, 9.7 at presentation, 8.4 yesterday.  -According to patient's daughter, in [facility], hemoglobin level was low close to 7.  She received 2 units of PRBCs.  Also received iron infusion and vitamin B12.  Per daughter, colonoscopy was planned but not done because patient was on Plavix.  She was discharged home on iron tablets. -No active bleeding.  Check FOBT.  Repeat hemoglobin tomorrow.  Chronic diastolic CHF, CAD status post stents -Appears to be compensated at this time, but this may  be challenging to predict given her chronic LE lymphedema -No apparent edema on CXR -Continue Plavix, Lipitor, Toprol XL. -Seems adequately  hydrated.  IV fluids stopped yesterday.  Keep Lasix on hold while patient is actively having diarrhea.  DM -Diet controlled.  Follow A1c  Stage 3 CKD -Creatinine at the time of last hospital d/c was 1.08 -Currently 1.04.  Right thigh cyst  -Incidental finding.  Ultrasound lower extremity showed a cystic structure is found in the popliteal fossa that extends from the distal anterior thigh into the distal posterior thigh. -Asymptomatic.  Mobility: Encouraged ambulation.  PT recommended home health PT. Diet: Cardiac diet DVT prophylaxis: Heparin subcu Code Status:  Code Status: DNR  Family Communication:Discussed with patient's daughter on the phone. Expected Discharge: Pending PT evaluation  Consultants:  None  Procedures:  None  Antimicrobials:  Anti-infectives (From admission, onward)   Start     Dose/Rate Route Frequency Ordered Stop   08/30/18 2200  doxycycline (VIBRA-TABS) tablet 100 mg     100 mg Oral Every 12 hours 08/30/18 1457     08/30/18 1000  vancomycin (VANCOCIN) 50 mg/mL oral solution 125 mg     125 mg Oral 4 times daily 08/30/18 0834 09/13/18 0959   08/29/18 2200  levofloxacin (LEVAQUIN) IVPB 500 mg  Status:  Discontinued     500 mg 100 mL/hr over 60 Minutes Intravenous Every 24 hours 08/28/18 1332 08/30/18 1457   08/29/18 1430  vancomycin (VANCOCIN) IVPB 750 mg/150 ml premix  Status:  Discontinued     750 mg 150 mL/hr over 60 Minutes Intravenous Every 24 hours 08/28/18 1332 08/30/18 1414   08/28/18 1300  vancomycin (VANCOCIN) 2,000 mg in sodium chloride 0.9 % 500 mL IVPB     2,000 mg 250 mL/hr over 120 Minutes Intravenous  Once 08/28/18 1143 08/28/18 1532   08/28/18 1200  levofloxacin (LEVAQUIN) IVPB 750 mg     750 mg 100 mL/hr over 90 Minutes Intravenous  Once 08/28/18 1143 08/28/18 1433      Infusions:    Scheduled Meds: . atorvastatin  40 mg Oral QHS  . clopidogrel  75 mg Oral Daily  . doxycycline  100 mg Oral Q12H  . heparin  5,000  Units Subcutaneous Q8H  . metoprolol succinate  12.5 mg Oral Daily  . pantoprazole  40 mg Oral Daily  . sodium chloride flush  3 mL Intravenous Q12H  . vancomycin  125 mg Oral QID    PRN meds: acetaminophen **OR** acetaminophen, ondansetron **OR** ondansetron (ZOFRAN) IV   Objective: Vitals:   08/30/18 0559 08/30/18 1405  BP: 129/65 108/82  Pulse: 78 81  Resp: 17 18  Temp: 99 F (37.2 C) 99.5 F (37.5 C)  SpO2: 94% 96%    Intake/Output Summary (Last 24 hours) at 08/30/2018 1519 Last data filed at 08/30/2018 0601 Gross per 24 hour  Intake 490 ml  Output 925 ml  Net -435 ml   Filed Weights   08/28/18 1200  Weight: 94.6 kg   Weight change:  Body mass index is 35.8 kg/m.   Physical Exam: General exam: Appears calm and comfortable.  Skin: No rashes, lesions or ulcers. HEENT: Atraumatic, normocephalic, supple neck, no obvious bleeding Lungs: Clear to auscultation bilaterally CVS: Regular rate and rhythm, no murmur GI/Abd soft, nontender, nondistended, bowel sound present CNS: Alert, awake, oriented x3 Psychiatry: Mood appropriate Extremities: Bilateral lower extremity edema, leg cellulitis improving.  Data Review: I have personally reviewed the laboratory data and studies available.  Recent  Labs  Lab 08/28/18 0830 08/29/18 0245  WBC 8.0 7.9  HGB 9.7* 8.4*  HCT 31.5* 26.5*  MCV 98.7 95.7  PLT 227 204   Recent Labs  Lab 08/28/18 0830 08/29/18 0245  NA 139 141  K 4.6 4.5  CL 105 110  CO2 28 27  GLUCOSE 115* 100*  BUN 16 14  CREATININE 1.05* 1.04*  CALCIUM 9.1 8.3*  MG 1.9  --     Lorin GlassBinaya Zandyr Barnhill, MD  [facility] 08/30/2018

## 2018-08-31 DIAGNOSIS — I5032 Chronic diastolic (congestive) heart failure: Secondary | ICD-10-CM

## 2018-08-31 DIAGNOSIS — I10 Essential (primary) hypertension: Secondary | ICD-10-CM

## 2018-08-31 DIAGNOSIS — E119 Type 2 diabetes mellitus without complications: Secondary | ICD-10-CM

## 2018-08-31 DIAGNOSIS — A09 Infectious gastroenteritis and colitis, unspecified: Secondary | ICD-10-CM

## 2018-08-31 DIAGNOSIS — R918 Other nonspecific abnormal finding of lung field: Secondary | ICD-10-CM

## 2018-08-31 LAB — CBC WITH DIFFERENTIAL/PLATELET
Abs Immature Granulocytes: 0.21 10*3/uL — ABNORMAL HIGH (ref 0.00–0.07)
Basophils Absolute: 0 10*3/uL (ref 0.0–0.1)
Basophils Relative: 0 %
Eosinophils Absolute: 0.2 10*3/uL (ref 0.0–0.5)
Eosinophils Relative: 3 %
HCT: 26.8 % — ABNORMAL LOW (ref 36.0–46.0)
Hemoglobin: 8.5 g/dL — ABNORMAL LOW (ref 12.0–15.0)
Immature Granulocytes: 3 %
Lymphocytes Relative: 24 %
Lymphs Abs: 1.6 10*3/uL (ref 0.7–4.0)
MCH: 30.2 pg (ref 26.0–34.0)
MCHC: 31.7 g/dL (ref 30.0–36.0)
MCV: 95.4 fL (ref 80.0–100.0)
Monocytes Absolute: 0.8 10*3/uL (ref 0.1–1.0)
Monocytes Relative: 12 %
Neutro Abs: 4 10*3/uL (ref 1.7–7.7)
Neutrophils Relative %: 58 %
Platelets: 206 10*3/uL (ref 150–400)
RBC: 2.81 MIL/uL — ABNORMAL LOW (ref 3.87–5.11)
RDW: 15.3 % (ref 11.5–15.5)
WBC: 6.9 10*3/uL (ref 4.0–10.5)
nRBC: 0 % (ref 0.0–0.2)

## 2018-08-31 LAB — BASIC METABOLIC PANEL
Anion gap: 7 (ref 5–15)
BUN: 14 mg/dL (ref 8–23)
CO2: 28 mmol/L (ref 22–32)
Calcium: 8.3 mg/dL — ABNORMAL LOW (ref 8.9–10.3)
Chloride: 105 mmol/L (ref 98–111)
Creatinine, Ser: 1.03 mg/dL — ABNORMAL HIGH (ref 0.44–1.00)
GFR calc Af Amer: 57 mL/min — ABNORMAL LOW (ref >60)
GFR calc non Af Amer: 49 mL/min — ABNORMAL LOW (ref >60)
Glucose, Bld: 111 mg/dL — ABNORMAL HIGH (ref 70–99)
Potassium: 3.8 mmol/L (ref 3.5–5.1)
Sodium: 140 mmol/L (ref 135–145)

## 2018-08-31 LAB — HEMOGLOBIN A1C
Hgb A1c MFr Bld: 5.1 % (ref 4.8–5.6)
Mean Plasma Glucose: 99.67 mg/dL

## 2018-08-31 MED ORDER — DICLOFENAC SODIUM 1 % TD GEL
4.0000 g | Freq: Four times a day (QID) | TRANSDERMAL | Status: DC
  Administered 2018-08-31 – 2018-09-02 (×7): 4 g via TOPICAL
  Filled 2018-08-31: qty 100

## 2018-08-31 NOTE — Progress Notes (Signed)
Physical Therapy Treatment Patient Details Name: Gail CitizenYvonne Fluegel MRN: [id number] DOB: 05/18/1930 Today's Date: 08/31/2018    History of Present Illness Patient is an 85 year old female admitted with R LE cellulitis. PMH to include:anemia, CHF, CAD, DM, CKD. She was recently here after fall.    PT Comments    Pt very limited this session secondary to pain. Pt only able to transfer to chair with use of RW and min A. Pt with very limited weight shift to LLE. Given new deficits, feel pt will have increased difficulty performing mobility tasks at home, and may require SNF level therapies at d/c. If pain improves, however, feel pt will likely progress to home with HHPT. Will continue to follow acutely to maximize functional mobility independence and safety.     Follow Up Recommendations  SNF;Home health PT     Equipment Recommendations  None recommended by PT    Recommendations for Other Services       Precautions / Restrictions Precautions Precautions: Fall Restrictions Weight Bearing Restrictions: No    Mobility  Bed Mobility Overal bed mobility: Needs Assistance Bed Mobility: Supine to Sit     Supine to sit: Supervision;HOB elevated     General bed mobility comments: Supervision for safety. Increased time required to come to sitting. Required use of bed rails.   Transfers Overall transfer level: Needs assistance Equipment used: Rolling walker (2 wheeled) Transfers: Sit to/from UGI CorporationStand;Stand Pivot Transfers Sit to Stand: Min assist Stand pivot transfers: Min assist       General transfer comment: Min A for lift assist and steadying to stand and transfer to chair. Pt with increased L knee pain and very limited weightshift to LLE. Pt grimacing and grunting throughout transfer. Required cues for safe use of RW.   Ambulation/Gait             General Gait Details: Unable secondary to L knee pain    Stairs             Wheelchair Mobility    Modified Rankin  (Stroke Patients Only)       Balance Overall balance assessment: Needs assistance Sitting-balance support: No upper extremity supported;Feet supported Sitting balance-Leahy Scale: Good     Standing balance support: Bilateral upper extremity supported Standing balance-Leahy Scale: Poor Standing balance comment: Reliant on BUE support                             Cognition Arousal/Alertness: Awake/alert Behavior During Therapy: WFL for tasks assessed/performed Overall Cognitive Status: Within Functional Limits for tasks assessed                                        Exercises      General Comments        Pertinent Vitals/Pain Pain Assessment: Faces Faces Pain Scale: Hurts even more Pain Location: L knee Pain Descriptors / Indicators: Aching;Grimacing;Guarding Pain Intervention(s): Limited activity within patient's tolerance;Monitored during session;Repositioned    Home Living                      Prior Function            PT Goals (current goals can now be found in the care plan section) Acute Rehab PT Goals Patient Stated Goal: for L knee pain to improve PT Goal Formulation: With patient  Time For Goal Achievement: 09/03/18 Potential to Achieve Goals: Good Progress towards PT goals: Not progressing toward goals - comment(increased pain in L knee)    Frequency    Min 3X/week      PT Plan Discharge plan needs to be updated    Co-evaluation              AM-PAC PT "6 Clicks" Mobility   Outcome Measure  Help needed turning from your back to your side while in a flat bed without using bedrails?: A Little Help needed moving from lying on your back to sitting on the side of a flat bed without using bedrails?: A Little Help needed moving to and from a bed to a chair (including a wheelchair)?: A Little Help needed standing up from a chair using your arms (e.g., wheelchair or bedside chair)?: A Little Help needed to  walk in hospital room?: A Lot Help needed climbing 3-5 steps with a railing? : A Lot 6 Click Score: 16    End of Session Equipment Utilized During Treatment: Gait belt Activity Tolerance: Patient limited by pain Patient left: with chair alarm set;in chair;with call bell/phone within reach Nurse Communication: Mobility status PT Visit Diagnosis: Muscle weakness (generalized) (M62.81);Difficulty in walking, not elsewhere classified (R26.2);Pain;History of falling (Z91.81) Pain - Right/Left: Left Pain - part of body: Knee     Time: 1206-1227 PT Time Calculation (min) (ACUTE ONLY): 21 min  Charges:  $Therapeutic Activity: 8-22 mins                     Leighton Ruff, PT, DPT  Acute Rehabilitation Services  Pager: [phone] Office: [phone]    Rudean Hitt 08/31/2018, 1:33 PM

## 2018-08-31 NOTE — Progress Notes (Signed)
PROGRESS NOTE    Gail CitizenYvonne Coupland  ZOX:[id number] DOB: 04/07/1930 DOA: 08/28/2018 PCP: System, Pcp Not In   Brief Narrative: Gail Page is a 85 y.o. femalewith medical history significant ofCAD (2017);hypertension, Type2 Diabetes, hyperlipidemia , chronic renal insufficiency, degenerative joint disease, history of CVA,chronic venousinsufficiencypresenting with fever. Patient found to have cellulitis in addition to C. Difficile colitis.   Assessment & Plan:   Principal Problem:   Cellulitis of leg, right Active Problems:   Diarrhea   Opacity of lung on imaging study   Anemia of chronic disease   Chronic diastolic CHF (congestive heart failure) (HCC)   Essential hypertension   CAD (coronary artery disease)   Diabetes mellitus type 2, diet-controlled (HCC)   CKD (chronic kidney disease) stage 3, GFR 30-59 ml/min (HCC)   Right lower extremity cellulitis Patient started on IV vancomycin and Levaquin on admission and transitioned to doxycycline. Cellulitis significantly improved -Continue Doxycycline  C. Difficile diarrhea Patient started on PO Vancomycin with plans for 2 weeks  LLL opacity Low suspicion for pneumonia. Recommend outpatient repeat chest x-ray  Anemia Unknown etiology. Stable.   Chronic diastolic heart failure Stable.  Diabetes mellitus, type 2 Diet controlled. Hemoglobin A1C of 5.1%.  Right thigh cyst Incidental finding. Outpatient follow-up.    DVT prophylaxis: Subq heparin Code Status:   Code Status: DNR Family Communication: None Disposition Plan: Discharge in 24 hours if stool frequency improved   Consultants:   None  Procedures:   None  Antimicrobials:  Vancomycin IV  Levaquin IV  Doxycycline  Vancomycin PO    Subjective: Some left knee pain today  Objective: Vitals:   08/30/18 2134 08/31/18 0545 08/31/18 0900 08/31/18 1200  BP: (!) 114/55 137/67 131/85 98/63  Pulse: 78 78 81 78  Resp:   17 17  Temp: 98.7 F  (37.1 C) 98.4 F (36.9 C) 99.3 F (37.4 C) 97.6 F (36.4 C)  TempSrc: Oral Oral  Oral  SpO2: 94% 93% 94% 100%  Weight:      Height:        Intake/Output Summary (Last 24 hours) at 08/31/2018 1428 Last data filed at 08/31/2018 0900 Gross per 24 hour  Intake 240 ml  Output -  Net 240 ml   Filed Weights   08/28/18 1200  Weight: 94.6 kg    Examination:  General exam: Appears calm and comfortable Respiratory system: Clear to auscultation. Respiratory effort normal. Cardiovascular system: S1 & S2 heard, RRR. No murmurs, rubs, gallops or clicks. Gastrointestinal system: Abdomen is nondistended, soft and nontender. No organomegaly or masses felt. Normal bowel sounds heard. Central nervous system: Alert and oriented. No focal neurological deficits. Extremities: Bilateral leg swelling. Left knee with effusion No calf tenderness Skin: No cyanosis. No rashes Psychiatry: Judgement and insight appear normal. Mood & affect appropriate.     Data Reviewed: I have personally reviewed following labs and imaging studies  CBC: Recent Labs  Lab 08/28/18 0830 08/29/18 0245 08/31/18 0249  WBC 8.0 7.9 6.9  NEUTROABS  --   --  4.0  HGB 9.7* 8.4* 8.5*  HCT 31.5* 26.5* 26.8*  MCV 98.7 95.7 95.4  PLT 227 204 206   Basic Metabolic Panel: Recent Labs  Lab 08/28/18 0830 08/29/18 0245 08/31/18 0249  NA 139 141 140  K 4.6 4.5 3.8  CL 105 110 105  CO2 28 27 28   GLUCOSE 115* 100* 111*  BUN 16 14 14   CREATININE 1.05* 1.04* 1.03*  CALCIUM 9.1 8.3* 8.3*  MG 1.9  --   --  GFR: Estimated Creatinine Clearance: 43.8 mL/min (A) (by C-G formula based on SCr of 1.03 mg/dL (H)). Liver Function Tests: Recent Labs  Lab 08/28/18 0830  AST 32  ALT 24  ALKPHOS 59  BILITOT 1.3*  PROT 5.4*  ALBUMIN 2.6*   No results for input(s): LIPASE, AMYLASE in the last 168 hours. No results for input(s): AMMONIA in the last 168 hours. Coagulation Profile: No results for input(s): INR, PROTIME in the  last 168 hours. Cardiac Enzymes: No results for input(s): CKTOTAL, CKMB, CKMBINDEX, TROPONINI in the last 168 hours. BNP (last 3 results) No results for input(s): PROBNP in the last 8760 hours. HbA1C: Recent Labs    08/31/18 0249  HGBA1C 5.1   CBG: No results for input(s): GLUCAP in the last 168 hours. Lipid Profile: No results for input(s): CHOL, HDL, LDLCALC, TRIG, CHOLHDL, LDLDIRECT in the last 72 hours. Thyroid Function Tests: No results for input(s): TSH, T4TOTAL, FREET4, T3FREE, THYROIDAB in the last 72 hours. Anemia Panel: No results for input(s): VITAMINB12, FOLATE, FERRITIN, TIBC, IRON, RETICCTPCT in the last 72 hours. Sepsis Labs: Recent Labs  Lab 08/28/18 1543 08/28/18 1716  PROCALCITON  --  <0.10  LATICACIDVEN 1.1 1.8    Recent Results (from the past 240 hour(s))  SARS Coronavirus 2 [facility] order, Performed in [facility] hospital lab) Nasopharyngeal Nasopharyngeal Swab     Status: None   Collection Time: 08/28/18  8:02 AM   Specimen: Nasopharyngeal Swab  Result Value Ref Range Status   SARS Coronavirus 2 NEGATIVE NEGATIVE Final    Comment: (NOTE) If result is NEGATIVE SARS-CoV-2 target nucleic acids are NOT DETECTED. The SARS-CoV-2 RNA is generally detectable in upper and lower  respiratory specimens during the acute phase of infection. The lowest  concentration of SARS-CoV-2 viral copies this assay can detect is 250  copies / mL. A negative result does not preclude SARS-CoV-2 infection  and should not be used as the sole basis for treatment or other  patient management decisions.  A negative result may occur with  improper specimen collection / handling, submission of specimen other  than nasopharyngeal swab, presence of viral mutation(s) within the  areas targeted by this assay, and inadequate number of viral copies  (<250 copies / mL). A negative result must be combined with clinical  observations, patient history, and epidemiological information. If  result is POSITIVE SARS-CoV-2 target nucleic acids are DETECTED. The SARS-CoV-2 RNA is generally detectable in upper and lower  respiratory specimens dur ing the acute phase of infection.  Positive  results are indicative of active infection with SARS-CoV-2.  Clinical  correlation with patient history and other diagnostic information is  necessary to determine patient infection status.  Positive results do  not rule out bacterial infection or co-infection with other viruses. If result is PRESUMPTIVE POSTIVE SARS-CoV-2 nucleic acids MAY BE PRESENT.   A presumptive positive result was obtained on the submitted specimen  and confirmed on repeat testing.  While 2019 novel coronavirus  (SARS-CoV-2) nucleic acids may be present in the submitted sample  additional confirmatory testing may be necessary for epidemiological  and / or clinical management purposes  to differentiate between  SARS-CoV-2 and other Sarbecovirus currently known to infect humans.  If clinically indicated additional testing with an alternate test  methodology [id number]) is advised. The SARS-CoV-2 RNA is generally  detectable in upper and lower respiratory sp ecimens during the acute  phase of infection. The expected result is Negative. Fact Sheet for Patients:  StrictlyIdeas.no Fact  Sheet for Healthcare Providers: https://pope.com/https://www.fda.gov/media/136313/download This test is not yet approved or cleared by the [place] FDA and has been authorized for detection and/or diagnosis of SARS-CoV-2 by FDA under an Emergency Use Authorization (EUA).  This EUA will remain in effect (meaning this test can be used) for the duration of the COVID-19 declaration under Section 564(b)(1) of the Act, 21 U.S.C. section 360bbb-3(b)(1), unless the authorization is terminated or revoked sooner. Performed at [facility], [address] [place], [place] [address]   Urine culture     Status: Abnormal    Collection Time: 08/28/18 11:43 AM   Specimen: In/Out Cath Urine  Result Value Ref Range Status   Specimen Description IN/OUT CATH URINE  Final   Special Requests NONE  Final   Culture (A)  Final    >=100,000 COLONIES/mL LACTOBACILLUS SPECIES Standardized susceptibility testing for this organism is not available. Performed at [facility], [address] [place], [place] [address]    Report Status 08/30/2018 FINAL  Final  Blood culture (routine x 2)     Status: Abnormal   Collection Time: 08/28/18 12:33 PM   Specimen: BLOOD  Result Value Ref Range Status   Specimen Description BLOOD SITE NOT SPECIFIED  Final   Special Requests   Final    BOTTLES DRAWN AEROBIC AND ANAEROBIC Blood Culture adequate volume   Culture  Setup Time   Final    GRAM POSITIVE COCCI AEROBIC BOTTLE ONLY CRITICAL RESULT CALLED TO, READ BACK BY AND VERIFIED WITH: Norva RiffleE. Sinclair PharmD 11:35 08/29/18 ([email or web address])    Culture (A)  Final    STAPHYLOCOCCUS SPECIES (COAGULASE NEGATIVE) THE SIGNIFICANCE OF ISOLATING THIS ORGANISM FROM A SINGLE SET OF BLOOD CULTURES WHEN MULTIPLE SETS ARE DRAWN IS UNCERTAIN. PLEASE NOTIFY THE MICROBIOLOGY DEPARTMENT WITHIN ONE WEEK IF SPECIATION AND SENSITIVITIES ARE REQUIRED. Performed at [facility], [address] [place], [place] [address]    Report Status 08/30/2018 FINAL  Final  Blood Culture ID Panel (Reflexed)     Status: Abnormal   Collection Time: 08/28/18 12:33 PM  Result Value Ref Range Status   Enterococcus species NOT DETECTED NOT DETECTED Final   Listeria monocytogenes NOT DETECTED NOT DETECTED Final   Staphylococcus species DETECTED (A) NOT DETECTED Final    Comment: Methicillin (oxacillin) susceptible coagulase negative staphylococcus. Possible blood culture contaminant (unless isolated from more than one blood culture draw or clinical case suggests pathogenicity). No antibiotic treatment is indicated for blood  culture contaminants. CRITICAL RESULT CALLED TO,  READ BACK BY AND VERIFIED WITH: Norva RiffleE. Sinclair PharmD 11:35 08/29/18 ([email or web address])    Staphylococcus aureus (BCID) NOT DETECTED NOT DETECTED Final   Methicillin resistance NOT DETECTED NOT DETECTED Final   Streptococcus species NOT DETECTED NOT DETECTED Final   Streptococcus agalactiae NOT DETECTED NOT DETECTED Final   Streptococcus pneumoniae NOT DETECTED NOT DETECTED Final   Streptococcus pyogenes NOT DETECTED NOT DETECTED Final   Acinetobacter baumannii NOT DETECTED NOT DETECTED Final   Enterobacteriaceae species NOT DETECTED NOT DETECTED Final   Enterobacter cloacae complex NOT DETECTED NOT DETECTED Final   Escherichia coli NOT DETECTED NOT DETECTED Final   Klebsiella oxytoca NOT DETECTED NOT DETECTED Final   Klebsiella pneumoniae NOT DETECTED NOT DETECTED Final   Proteus species NOT DETECTED NOT DETECTED Final   Serratia marcescens NOT DETECTED NOT DETECTED Final   Haemophilus influenzae NOT DETECTED NOT DETECTED Final   Neisseria meningitidis NOT DETECTED NOT DETECTED Final   Pseudomonas aeruginosa NOT DETECTED NOT  DETECTED Final   Candida albicans NOT DETECTED NOT DETECTED Final   Candida glabrata NOT DETECTED NOT DETECTED Final   Candida krusei NOT DETECTED NOT DETECTED Final   Candida parapsilosis NOT DETECTED NOT DETECTED Final   Candida tropicalis NOT DETECTED NOT DETECTED Final    Comment: Performed at [facility], [address] [place], [place] [address]  Blood culture (routine x 2)     Status: None (Preliminary result)   Collection Time: 08/28/18  5:16 PM   Specimen: BLOOD RIGHT HAND  Result Value Ref Range Status   Specimen Description BLOOD RIGHT HAND  Final   Special Requests   Final    BOTTLES DRAWN AEROBIC ONLY Blood Culture adequate volume   Culture   Final    NO GROWTH 3 DAYS Performed at [facility], [address] [place], [place] [address]    Report Status PENDING  Incomplete  C difficile quick scan w PCR reflex     Status: Abnormal   Collection  Time: 08/28/18  6:06 PM   Specimen: STOOL  Result Value Ref Range Status   C Diff antigen POSITIVE (A) NEGATIVE Final   C Diff toxin POSITIVE (A) NEGATIVE Final   C Diff interpretation Toxin producing C. difficile detected.  Final    Comment: CRITICAL RESULT CALLED TO, READ BACK BY AND VERIFIED WITH: Leilani AbleK MURRELL RN [id number] 08/29/18 A BROWNING Performed at [facility], [address] [place], [place] [address]          Radiology Studies: No results found.      Scheduled Meds: . atorvastatin  40 mg Oral QHS  . clopidogrel  75 mg Oral Daily  . diclofenac sodium  4 g Topical QID  . doxycycline  100 mg Oral Q12H  . heparin  5,000 Units Subcutaneous Q8H  . metoprolol succinate  12.5 mg Oral Daily  . pantoprazole  40 mg Oral Daily  . sodium chloride flush  3 mL Intravenous Q12H  . vancomycin  125 mg Oral QID   Continuous Infusions:   LOS: 3 days     Jacquelin Hawkingalph Nettey, MD [facility] 08/31/2018, 2:28 PM  If 7PM-7AM, please contact night-coverage [email or web address]

## 2018-08-31 NOTE — TOC Initial Note (Signed)
Transition of Care [facility]) - Initial/Assessment Note    Patient Details  Name: Gail Page MRN: [id number] Date of Birth: 03/19/1930  [facility] CM/SW Contact:    Sharin Mons, RN Phone Number: 08/31/2018, 4:29 PM  Clinical Narrative:             Readmitted with RLE cellulitis. From home with daughter. P[facility] with [facility].  Per PT's evaluation/ recommendations: SNF. Pt/daughter declined SNF placement. Daughter states pt will d/c to home with the resumption of home health services when medically ready.  Gail Page (Daughter)     [phone]      No present needs identified per NCM.... NCM will continue to monitor.  Expected Discharge Plan: [place] Barriers to Discharge: Continued Medical Work up   Patient Goals and CMS Choice Patient states their goals for this hospitalization and ongoing recovery are:: to go home with daughter   Choice offered to / list presented to : Adult Children, Patient  Expected Discharge Plan and Services Expected Discharge Plan: [place]   Discharge Planning Services: CM Consult                     DME Arranged: N/A DME Agency: NA       HH Arranged: PT HH Agency: [facility] Date [place]: 08/31/18 Time HH Agency Contacted: 1612 Representative spoke with at Uncertain: Dorian Pod  Prior Living Arrangements/Services   Lives with:: Adult Children Patient language and need for interpreter reviewed:: Yes Do you feel safe going back to the place where you live?: Yes      Need for Family Participation in Patient Care: Yes (Comment) Care giver support system in place?: Yes (comment)   Criminal Activity/Legal Involvement Pertinent to Current Situation/Hospitalization: No - Comment as needed  Activities of Daily Living      Permission Sought/Granted   Permission granted to share information with : Yes, Verbal Permission Granted  Share  Information with NAME: Gail Page           Emotional Assessment Appearance:: Appears stated age Attitude/Demeanor/Rapport: Engaged Affect (typically observed): Accepting Orientation: : Oriented to Self, Oriented to Place, Oriented to  Time, Oriented to Situation Alcohol / Substance Use: Not Applicable Psych Involvement: No (comment)  Admission diagnosis:  Fever [R50.9] Cellulitis of right lower extremity [L03.115] Patient Active Problem List   Diagnosis Date Noted  . Cellulitis of leg, right 08/28/2018  . Diarrhea 08/28/2018  . Opacity of lung on imaging study 08/28/2018  . Anemia of chronic disease 08/28/2018  . Chronic diastolic CHF (congestive heart failure) ([place]) 08/28/2018  . Essential hypertension 08/28/2018  . CAD (coronary artery disease) 08/28/2018  . Diabetes mellitus type 2, diet-controlled ([place]) 08/28/2018  . CKD (chronic kidney disease) stage 3, GFR 30-59 ml/min (HCC) 08/28/2018   PCP:  System, Pcp Not In Pharmacy:   [place], [place] [address] Phone: [phone] Fax: [phone]     Social Determinants of Health (SDOH) Interventions    Readmission Risk Interventions No flowsheet data found.

## 2018-09-01 ENCOUNTER — Inpatient Hospital Stay (HOSPITAL_COMMUNITY): Payer: Medicare Other

## 2018-09-01 LAB — SYNOVIAL CELL COUNT + DIFF, W/ CRYSTALS
Eosinophils-Synovial: 0 % (ref 0–1)
Lymphocytes-Synovial Fld: 0 % (ref 0–20)
Monocyte-Macrophage-Synovial Fluid: 4 % — ABNORMAL LOW (ref 50–90)
Neutrophil, Synovial: 96 % — ABNORMAL HIGH (ref 0–25)
WBC, Synovial: 3400 /mm3 — ABNORMAL HIGH (ref 0–200)

## 2018-09-01 MED ORDER — BUPIVACAINE HCL (PF) 0.5 % IJ SOLN
10.0000 mL | Freq: Once | INTRAMUSCULAR | Status: AC
  Administered 2018-09-01: 16:00:00 10 mL
  Filled 2018-09-01: qty 10

## 2018-09-01 MED ORDER — METHYLPREDNISOLONE ACETATE 80 MG/ML IJ SUSP
80.0000 mg | Freq: Once | INTRAMUSCULAR | Status: AC
  Administered 2018-09-01: 16:00:00 80 mg via INTRA_ARTICULAR
  Filled 2018-09-01 (×2): qty 1

## 2018-09-01 NOTE — Progress Notes (Signed)
PROGRESS NOTE    Gail CitizenYvonne Taft  WUJ:[id number] DOB: 11/29/1930 DOA: 08/28/2018 PCP: System, Pcp Not In   Brief Narrative: Gail Page is a 85 y.o. femalewith medical history significant ofCAD (2017);hypertension, Type2 Diabetes, hyperlipidemia , chronic renal insufficiency, degenerative joint disease, history of CVA,chronic venousinsufficiencypresenting with fever. Patient found to have cellulitis in addition to C. Difficile colitis.   Assessment & Plan:   Principal Problem:   Cellulitis of leg, right Active Problems:   Diarrhea   Opacity of lung on imaging study   Anemia of chronic disease   Chronic diastolic CHF (congestive heart failure) (HCC)   Essential hypertension   CAD (coronary artery disease)   Diabetes mellitus type 2, diet-controlled (HCC)   CKD (chronic kidney disease) stage 3, GFR 30-59 ml/min (HCC)   Right lower extremity cellulitis Patient started on IV vancomycin and Levaquin on admission and transitioned to doxycycline. Cellulitis significantly improved -Continue Doxycycline  C. Difficile diarrhea Patient started on PO Vancomycin with plans for 2 weeks  LLL opacity Low suspicion for pneumonia. Recommend outpatient repeat chest x-ray  Anemia Unknown etiology. Stable.   Chronic diastolic heart failure Stable.  Diabetes mellitus, type 2 Diet controlled. Hemoglobin A1C of 5.1%.  Right thigh cyst Incidental finding. Outpatient follow-up.  Left knee pain Likely secondary to arthritis. -Voltaren gel -Ortho consult for arthrocentesis   DVT prophylaxis: Subq heparin Code Status:   Code Status: DNR Family Communication: None Disposition Plan: Discharge to SNF when bed available   Consultants:   Orthopedic surgery  Procedures:   None  Antimicrobials:  Vancomycin IV  Levaquin IV  Doxycycline  Vancomycin PO    Subjective: Some left knee pain today  Objective: Vitals:   08/31/18 1200 08/31/18 1613 08/31/18 2149 09/01/18  1425  BP: 98/63 (!) 142/68 (!) 128/57 132/60  Pulse: 78 74 78 74  Resp: 17 18  18   Temp: 97.6 F (36.4 C) 98.9 F (37.2 C) 99 F (37.2 C) 98.6 F (37 C)  TempSrc: Oral Oral Oral Oral  SpO2: 100% 98% 97% 98%  Weight:      Height:        Intake/Output Summary (Last 24 hours) at 09/01/2018 1444 Last data filed at 08/31/2018 1800 Gross per 24 hour  Intake 240 ml  Output -  Net 240 ml   Filed Weights   08/28/18 1200  Weight: 94.6 kg    Examination:  General exam: Appears calm and comfortable Respiratory system: Clear to auscultation. Respiratory effort normal. Cardiovascular system: S1 & S2 heard, RRR. No murmurs, rubs, gallops or clicks. Gastrointestinal system: Abdomen is nondistended, soft and nontender. No organomegaly or masses felt. Normal bowel sounds heard. Central nervous system: Alert and oriented. No focal neurological deficits. Extremities:  No calf tenderness Skin: No cyanosis. No rashes Psychiatry: Judgement and insight appear normal. Mood & affect appropriate.     Data Reviewed: I have personally reviewed following labs and imaging studies  CBC: Recent Labs  Lab 08/28/18 0830 08/29/18 0245 08/31/18 0249  WBC 8.0 7.9 6.9  NEUTROABS  --   --  4.0  HGB 9.7* 8.4* 8.5*  HCT 31.5* 26.5* 26.8*  MCV 98.7 95.7 95.4  PLT 227 204 206   Basic Metabolic Panel: Recent Labs  Lab 08/28/18 0830 08/29/18 0245 08/31/18 0249  NA 139 141 140  K 4.6 4.5 3.8  CL 105 110 105  CO2 28 27 28   GLUCOSE 115* 100* 111*  BUN 16 14 14   CREATININE 1.05* 1.04* 1.03*  CALCIUM 9.1  8.3* 8.3*  MG 1.9  --   --    GFR: Estimated Creatinine Clearance: 43.8 mL/min (A) (by C-G formula based on SCr of 1.03 mg/dL (H)). Liver Function Tests: Recent Labs  Lab 08/28/18 0830  AST 32  ALT 24  ALKPHOS 59  BILITOT 1.3*  PROT 5.4*  ALBUMIN 2.6*   No results for input(s): LIPASE, AMYLASE in the last 168 hours. No results for input(s): AMMONIA in the last 168 hours. Coagulation  Profile: No results for input(s): INR, PROTIME in the last 168 hours. Cardiac Enzymes: No results for input(s): CKTOTAL, CKMB, CKMBINDEX, TROPONINI in the last 168 hours. BNP (last 3 results) No results for input(s): PROBNP in the last 8760 hours. HbA1C: Recent Labs    08/31/18 0249  HGBA1C 5.1   CBG: No results for input(s): GLUCAP in the last 168 hours. Lipid Profile: No results for input(s): CHOL, HDL, LDLCALC, TRIG, CHOLHDL, LDLDIRECT in the last 72 hours. Thyroid Function Tests: No results for input(s): TSH, T4TOTAL, FREET4, T3FREE, THYROIDAB in the last 72 hours. Anemia Panel: No results for input(s): VITAMINB12, FOLATE, FERRITIN, TIBC, IRON, RETICCTPCT in the last 72 hours. Sepsis Labs: Recent Labs  Lab 08/28/18 1543 08/28/18 1716  PROCALCITON  --  <0.10  LATICACIDVEN 1.1 1.8    Recent Results (from the past 240 hour(s))  SARS Coronavirus 2 [facility](Hospital order, Performed in [facility] hospital lab) Nasopharyngeal Nasopharyngeal Swab     Status: None   Collection Time: 08/28/18  8:02 AM   Specimen: Nasopharyngeal Swab  Result Value Ref Range Status   SARS Coronavirus 2 NEGATIVE NEGATIVE Final    Comment: (NOTE) If result is NEGATIVE SARS-CoV-2 target nucleic acids are NOT DETECTED. The SARS-CoV-2 RNA is generally detectable in upper and lower  respiratory specimens during the acute phase of infection. The lowest  concentration of SARS-CoV-2 viral copies this assay can detect is 250  copies / mL. A negative result does not preclude SARS-CoV-2 infection  and should not be used as the sole basis for treatment or other  patient management decisions.  A negative result may occur with  improper specimen collection / handling, submission of specimen other  than nasopharyngeal swab, presence of viral mutation(s) within the  areas targeted by this assay, and inadequate number of viral copies  (<250 copies / mL). A negative result must be combined with clinical  observations,  patient history, and epidemiological information. If result is POSITIVE SARS-CoV-2 target nucleic acids are DETECTED. The SARS-CoV-2 RNA is generally detectable in upper and lower  respiratory specimens dur ing the acute phase of infection.  Positive  results are indicative of active infection with SARS-CoV-2.  Clinical  correlation with patient history and other diagnostic information is  necessary to determine patient infection status.  Positive results do  not rule out bacterial infection or co-infection with other viruses. If result is PRESUMPTIVE POSTIVE SARS-CoV-2 nucleic acids MAY BE PRESENT.   A presumptive positive result was obtained on the submitted specimen  and confirmed on repeat testing.  While 2019 novel coronavirus  (SARS-CoV-2) nucleic acids may be present in the submitted sample  additional confirmatory testing may be necessary for epidemiological  and / or clinical management purposes  to differentiate between  SARS-CoV-2 and other Sarbecovirus currently known to infect humans.  If clinically indicated additional testing with an alternate test  methodology [phone]([id number]) is advised. The SARS-CoV-2 RNA is generally  detectable in upper and lower respiratory sp ecimens during the acute  phase of  infection. The expected result is Negative. Fact Sheet for Patients:  BoilerBrush.com.cyhttps://www.fda.gov/media/136312/download Fact Sheet for Healthcare Providers: https://pope.com/https://www.fda.gov/media/136313/download This test is not yet approved or cleared by the [place] FDA and has been authorized for detection and/or diagnosis of SARS-CoV-2 by FDA under an Emergency Use Authorization (EUA).  This EUA will remain in effect (meaning this test can be used) for the duration of the COVID-19 declaration under Section 564(b)(1) of the Act, 21 U.S.C. section 360bbb-3(b)(1), unless the authorization is terminated or revoked sooner. Performed at [facility], [address] [place], [place]  [address]   Urine culture     Status: Abnormal   Collection Time: 08/28/18 11:43 AM   Specimen: In/Out Cath Urine  Result Value Ref Range Status   Specimen Description IN/OUT CATH URINE  Final   Special Requests NONE  Final   Culture (A)  Final    >=100,000 COLONIES/mL LACTOBACILLUS SPECIES Standardized susceptibility testing for this organism is not available. Performed at [facility], [address] [place], [place] [address]    Report Status 08/30/2018 FINAL  Final  Blood culture (routine x 2)     Status: Abnormal   Collection Time: 08/28/18 12:33 PM   Specimen: BLOOD  Result Value Ref Range Status   Specimen Description BLOOD SITE NOT SPECIFIED  Final   Special Requests   Final    BOTTLES DRAWN AEROBIC AND ANAEROBIC Blood Culture adequate volume   Culture  Setup Time   Final    GRAM POSITIVE COCCI AEROBIC BOTTLE ONLY CRITICAL RESULT CALLED TO, READ BACK BY AND VERIFIED WITH: Norva RiffleE. Sinclair PharmD 11:35 08/29/18 ([email or web address])    Culture (A)  Final    STAPHYLOCOCCUS SPECIES (COAGULASE NEGATIVE) THE SIGNIFICANCE OF ISOLATING THIS ORGANISM FROM A SINGLE SET OF BLOOD CULTURES WHEN MULTIPLE SETS ARE DRAWN IS UNCERTAIN. PLEASE NOTIFY THE MICROBIOLOGY DEPARTMENT WITHIN ONE WEEK IF SPECIATION AND SENSITIVITIES ARE REQUIRED. Performed at [facility], [address] [place], [place] [address]    Report Status 08/30/2018 FINAL  Final  Blood Culture ID Panel (Reflexed)     Status: Abnormal   Collection Time: 08/28/18 12:33 PM  Result Value Ref Range Status   Enterococcus species NOT DETECTED NOT DETECTED Final   Listeria monocytogenes NOT DETECTED NOT DETECTED Final   Staphylococcus species DETECTED (A) NOT DETECTED Final    Comment: Methicillin (oxacillin) susceptible coagulase negative staphylococcus. Possible blood culture contaminant (unless isolated from more than one blood culture draw or clinical case suggests pathogenicity). No antibiotic treatment is indicated for blood   culture contaminants. CRITICAL RESULT CALLED TO, READ BACK BY AND VERIFIED WITH: Norva RiffleE. Sinclair PharmD 11:35 08/29/18 ([email or web address])    Staphylococcus aureus (BCID) NOT DETECTED NOT DETECTED Final   Methicillin resistance NOT DETECTED NOT DETECTED Final   Streptococcus species NOT DETECTED NOT DETECTED Final   Streptococcus agalactiae NOT DETECTED NOT DETECTED Final   Streptococcus pneumoniae NOT DETECTED NOT DETECTED Final   Streptococcus pyogenes NOT DETECTED NOT DETECTED Final   Acinetobacter baumannii NOT DETECTED NOT DETECTED Final   Enterobacteriaceae species NOT DETECTED NOT DETECTED Final   Enterobacter cloacae complex NOT DETECTED NOT DETECTED Final   Escherichia coli NOT DETECTED NOT DETECTED Final   Klebsiella oxytoca NOT DETECTED NOT DETECTED Final   Klebsiella pneumoniae NOT DETECTED NOT DETECTED Final   Proteus species NOT DETECTED NOT DETECTED Final   Serratia marcescens NOT DETECTED NOT DETECTED Final   Haemophilus influenzae NOT DETECTED NOT DETECTED Final   Neisseria  meningitidis NOT DETECTED NOT DETECTED Final   Pseudomonas aeruginosa NOT DETECTED NOT DETECTED Final   Candida albicans NOT DETECTED NOT DETECTED Final   Candida glabrata NOT DETECTED NOT DETECTED Final   Candida krusei NOT DETECTED NOT DETECTED Final   Candida parapsilosis NOT DETECTED NOT DETECTED Final   Candida tropicalis NOT DETECTED NOT DETECTED Final    Comment: Performed at [facility], [place] [address], [place], [place] [address]  Blood culture (routine x 2)     Status: None (Preliminary result)   Collection Time: 08/28/18  5:16 PM   Specimen: BLOOD RIGHT HAND  Result Value Ref Range Status   Specimen Description BLOOD RIGHT HAND  Final   Special Requests   Final    BOTTLES DRAWN AEROBIC ONLY Blood Culture adequate volume   Culture   Final    NO GROWTH 3 DAYS Performed at [facility], [place] [address], [place], [place] [address]    Report Status PENDING  Incomplete  C difficile quick  scan w PCR reflex     Status: Abnormal   Collection Time: 08/28/18  6:06 PM   Specimen: STOOL  Result Value Ref Range Status   C Diff antigen POSITIVE (A) NEGATIVE Final   C Diff toxin POSITIVE (A) NEGATIVE Final   C Diff interpretation Toxin producing C. difficile detected.  Final    Comment: CRITICAL RESULT CALLED TO, READ BACK BY AND VERIFIED WITH: Tonita Cong RN [id number] 08/29/18 A BROWNING Performed at [facility], [place] [address] [place], [place] [address]          Radiology Studies: Dg Knee Left Port  Result Date: 09/01/2018 CLINICAL DATA:  Effusion. EXAM: PORTABLE LEFT KNEE - 1-2 VIEW COMPARISON:  None. FINDINGS: Moderate to severe tricompartmental degenerative changes are identified. A moderate to large suprapatellar joint effusion is identified. No fracture or dislocation. IMPRESSION: Moderate to large suprapatellar joint effusion. Moderate to severe degenerative changes. No other acute abnormalities. Electronically Signed   By: Dorise Bullion III M.D   On: 09/01/2018 14:37        Scheduled Meds: . atorvastatin  40 mg Oral QHS  . bupivacaine  10 mL Infiltration Once  . clopidogrel  75 mg Oral Daily  . diclofenac sodium  4 g Topical QID  . doxycycline  100 mg Oral Q12H  . heparin  5,000 Units Subcutaneous Q8H  . methylPREDNISolone acetate  80 mg Intra-articular Once  . metoprolol succinate  12.5 mg Oral Daily  . pantoprazole  40 mg Oral Daily  . sodium chloride flush  3 mL Intravenous Q12H  . vancomycin  125 mg Oral QID   Continuous Infusions:   LOS: 4 days     Cordelia Poche, MD [facility] 09/01/2018, 2:44 PM  If 7PM-7AM, please contact night-coverage [email or web address]

## 2018-09-01 NOTE — Procedures (Signed)
Procedure: Left knee aspiration and injection  Indication: Left knee effusion(s)  Surgeon: Silvestre Gunner, PA-C  Assist: None  Anesthesia: None  EBL: None  Complications: None  Findings: After risks/benefits explained patient desires to undergo procedure. Consent obtained and time out performed. The left knee was sterilely prepped and aspirated. 1ml clear yellow fluid aspirated. 38ml 0.5% Marcaine and 80mg  depomedrol instilled. Pt tolerated the procedure well.    Lisette Abu, PA-C Orthopedic Surgery [phone]

## 2018-09-01 NOTE — Consult Note (Signed)
Reason for Consult:Left knee pain Referring Physician: R Estella Huskettey  Gail Page is an 85 y.o. female.  HPI: Gail BuddyYvonne has suffered with OA of her left knee for years. There was talk of doing TKA but declined 2/2 surgical risk. Over the last few days the knee has swollen and been painful enough that's impairing her ability to go home. Attending requested aspiration and steroid injection. She's never had one in her knee before.  Past Medical History:  Diagnosis Date  . Anemia   . CHF (congestive heart failure) (HCC)   . Coronary artery disease   . Diabetes mellitus without complication (HCC)   . Lymphadenopathy   . Neuropathy   . Renal disorder   . Stroke [facility](HCC)     Past Surgical History:  Procedure Laterality Date  . CHOLECYSTECTOMY    . TONSILLECTOMY    . TOTAL HIP ARTHROPLASTY      No family history on file.  Social History:  reports that she has never smoked. She has never used smokeless tobacco. She reports previous alcohol use. She reports that she does not use drugs.  Allergies:  Allergies  Allergen Reactions  . Penicillins Anaphylaxis    Did it involve swelling of the face/tongue/throat, SOB, or low BP? Yes Did it involve sudden or severe rash/hives, skin peeling, or any reaction on the inside of your mouth or nose? No Did you need to seek medical attention at a hospital or doctor's office? No When did it last happen? when 85 years old If all above answers are "NO", may proceed with cephalosporin use.   . Latex Other (See Comments)    Fluid coming out  . Pork-Derived Products     Pt does not eat pork. I spoke with patient - she is ok with heparin.    Medications: I have reviewed the patient's current medications.  Results for orders placed or performed during the hospital encounter of 08/28/18 (from the past 48 hour(s))  Basic metabolic panel     Status: Abnormal   Collection Time: 08/31/18  2:49 AM  Result Value Ref Range   Sodium 140 135 - 145 mmol/L   Potassium 3.8 3.5 - 5.1 mmol/L   Chloride 105 98 - 111 mmol/L   CO2 28 22 - 32 mmol/L   Glucose, Bld 111 (H) 70 - 99 mg/dL   BUN 14 8 - 23 mg/dL   Creatinine, Ser 0.981.03 (H) 0.44 - 1.00 mg/dL   Calcium 8.3 (L) 8.9 - 10.3 mg/dL   GFR calc non Af Amer 49 (L) >60 mL/min   GFR calc Af Amer 57 (L) >60 mL/min   Anion gap 7 5 - 15    Comment: Performed at [facility], [address] [place], [place] [address]  CBC with Differential/Platelet     Status: Abnormal   Collection Time: 08/31/18  2:49 AM  Result Value Ref Range   WBC 6.9 4.0 - 10.5 K/uL   RBC 2.81 (L) 3.87 - 5.11 MIL/uL   Hemoglobin 8.5 (L) 12.0 - 15.0 g/dL   HCT 78.226.8 (L) 95.636.0 - 21.346.0 %   MCV 95.4 80.0 - 100.0 fL   MCH 30.2 26.0 - 34.0 pg   MCHC 31.7 30.0 - 36.0 g/dL   RDW 08.615.3 57.811.5 - 46.915.5 %   Platelets 206 150 - 400 K/uL   nRBC 0.0 0.0 - 0.2 %   Neutrophils Relative % 58 %   Neutro Abs 4.0 1.7 - 7.7 K/uL   Lymphocytes Relative  24 %   Lymphs Abs 1.6 0.7 - 4.0 K/uL   Monocytes Relative 12 %   Monocytes Absolute 0.8 0.1 - 1.0 K/uL   Eosinophils Relative 3 %   Eosinophils Absolute 0.2 0.0 - 0.5 K/uL   Basophils Relative 0 %   Basophils Absolute 0.0 0.0 - 0.1 K/uL   Immature Granulocytes 3 %   Abs Immature Granulocytes 0.21 (H) 0.00 - 0.07 K/uL    Comment: Performed at [place] [address], [place], [place] [address]  Hemoglobin A1c     Status: None   Collection Time: 08/31/18  2:49 AM  Result Value Ref Range   Hgb A1c MFr Bld 5.1 4.8 - 5.6 %    Comment: (NOTE) Pre diabetes:          5.7%-6.4% Diabetes:              >6.4% Glycemic control for   <7.0% adults with diabetes    Mean Plasma Glucose 99.67 mg/dL    Comment: Performed at [place] [address] [place], [place] [address]    Dg Knee Left Port  Result Date: 09/01/2018 CLINICAL DATA:  Effusion. EXAM: PORTABLE LEFT KNEE - 1-2 VIEW COMPARISON:  None. FINDINGS: Moderate to severe tricompartmental degenerative changes are identified.  A moderate to large suprapatellar joint effusion is identified. No fracture or dislocation. IMPRESSION: Moderate to large suprapatellar joint effusion. Moderate to severe degenerative changes. No other acute abnormalities. Electronically Signed   By: Dorise Bullion III M.D   On: 09/01/2018 14:37    Review of Systems  Constitutional: Negative for weight loss.  HENT: Negative for ear discharge, ear pain, hearing loss and tinnitus.   Eyes: Negative for blurred vision, double vision, photophobia and pain.  Respiratory: Negative for cough, sputum production and shortness of breath.   Cardiovascular: Negative for chest pain.  Gastrointestinal: Negative for abdominal pain, nausea and vomiting.  Genitourinary: Negative for dysuria, flank pain, frequency and urgency.  Musculoskeletal: Positive for joint pain (Left knee). Negative for back pain, falls, myalgias and neck pain.  Neurological: Negative for dizziness, tingling, sensory change, focal weakness, loss of consciousness and headaches.  Endo/Heme/Allergies: Does not bruise/bleed easily.  Psychiatric/Behavioral: Negative for depression, memory loss and substance abuse. The patient is not nervous/anxious.    Blood pressure 132/60, pulse 74, temperature 98.6 F (37 C), temperature source Oral, resp. rate 18, height 5\' 4"  (1.626 m), weight 94.6 kg, SpO2 98 %. Physical Exam  Constitutional: She appears well-developed and well-nourished. No distress.  HENT:  Head: Normocephalic and atraumatic.  Eyes: Conjunctivae are normal. Right eye exhibits no discharge. Left eye exhibits no discharge. No scleral icterus.  Neck: Normal range of motion.  Cardiovascular: Normal rate and regular rhythm.  Respiratory: Effort normal. No respiratory distress.  Musculoskeletal:     Comments: LLE No traumatic wounds, ecchymosis, or rash  Mild TTP knee, mod effusion  No ankle effusion  Knee stable to varus/ valgus and anterior/posterior stress  Sens DPN, SPN, TN  intact  Motor EHL, ext, flex, evers 5/5  DP 2+, PT 1+, No significant edema  Neurological: She is alert.  Skin: Skin is warm and dry. She is not diaphoretic.  Psychiatric: She has a normal mood and affect. Her behavior is normal.    Assessment/Plan: Left knee pain -- Will aspirate and inject. May f/u with Dr. Marlou Sa as OP. Multiple medical problems including CAD (2017);hypertension, Type2 Diabetes, hyperlipidemia , chronic renal insufficiency,degenerative joint disease, history of  CVA,chronic venousinsufficiency, and OA -- per primary service    Freeman CaldronMichael J. Shirleyann Montero, PA-C Orthopedic Surgery [phone] 09/01/2018, 4:09 PM

## 2018-09-02 DIAGNOSIS — D638 Anemia in other chronic diseases classified elsewhere: Secondary | ICD-10-CM

## 2018-09-02 LAB — CULTURE, BLOOD (ROUTINE X 2)
Culture: NO GROWTH
Special Requests: ADEQUATE

## 2018-09-02 MED ORDER — DICLOFENAC SODIUM 1 % TD GEL: 4.0000 g | Freq: Four times a day (QID) | TRANSDERMAL | 0 refills | Status: AC

## 2018-09-02 MED ORDER — DOXYCYCLINE HYCLATE 100 MG PO TABS: 100.0000 mg | ORAL_TABLET | Freq: Two times a day (BID) | ORAL | 0 refills | Status: AC

## 2018-09-02 MED ORDER — VANCOMYCIN 50 MG/ML ORAL SOLUTION: 125.0000 mg | Freq: Four times a day (QID) | ORAL | 0 refills | Status: DC

## 2018-09-02 MED ORDER — VANCOMYCIN HCL 125 MG PO CAPS: 125.0000 mg | ORAL_CAPSULE | Freq: Four times a day (QID) | ORAL | 0 refills | Status: AC

## 2018-09-02 MED FILL — DICLOFENAC SODIUM 1% GEL: 1 | 12 days supply | Qty: 100 | Fill #0

## 2018-09-02 MED FILL — DOXYCYCLINE HYCLATE 100 MG: 100 | 2 days supply | Qty: 3 | Fill #0

## 2018-09-02 MED FILL — VANCOMYCIN HCL 125 MG CAP: 125 | 9 days supply | Qty: 36 | Fill #0

## 2018-09-02 NOTE — TOC Progression Note (Signed)
Transition of Care [facility]) - Progression Note    Patient Details  Name: Gail Page MRN: [id number] Date of Birth: 1930/09/10  Transition of Care [facility]) CM/SW Contact  Graves-Bigelow, Ocie Cornfield, RN Phone Number: 09/02/2018, 2:03 PM  Clinical Narrative:  CM confirmed that patient will utilize [facility] for services. SOC to begin within 24-48 hours post transition home. Daughter to provide transportation home. No further needs from CM at this time.    Expected Discharge Plan: [place] Barriers to Discharge: Continued Medical Work up  Expected Discharge Plan and Services Expected Discharge Plan: [place]   Discharge Planning Services: CM Consult     Expected Discharge Date: 09/02/18               DME Arranged: N/A DME Agency: NA       HH Arranged: PT HH Agency: [facility] Date [place] Agency Contacted: 08/31/18 Time [place]: 1612 Representative spoke with at [place]: [place] Interventions    Readmission Risk Interventions No flowsheet data found.

## 2018-09-02 NOTE — Discharge Instructions (Signed)
Gail Page,  You were in the hospital with cellulitis and also with C. Difficile. You have improved with antibiotics.

## 2018-09-02 NOTE — Progress Notes (Signed)
Physical Therapy Treatment Patient Details Name: Gail CitizenYvonne Page MRN: [id number] DOB: 10/11/1930 Today's Date: 09/02/2018    History of Present Illness Patient is an 85 year old female admitted with R LE cellulitis. PMH to include:anemia, CHF, CAD, DM, CKD. She was recently here after fall.    PT Comments    Pt feels much better after aspiration of her R knee.  She is performing all mobility with supervision to min guard assistance.  Pt is ready from a mobility stand point to d/c home with support from daughter and follow up HHPT.  Educated on stair sequencing as she denied need to practice before returning home.     Follow Up Recommendations  Home health PT     Equipment Recommendations  None recommended by PT    Recommendations for Other Services       Precautions / Restrictions Precautions Precautions: Fall Restrictions Weight Bearing Restrictions: No    Mobility  Bed Mobility               General bed mobility comments: Pt seated in recliner on arrival.  Transfers Overall transfer level: Needs assistance Equipment used: Rolling walker (2 wheeled) Transfers: Sit to/from Stand Sit to Stand: Supervision         General transfer comment: Supervision for safety.  Ambulation/Gait Ambulation/Gait assistance: Min guard Gait Distance (Feet): 120 Feet Assistive device: Rolling walker (2 wheeled) Gait Pattern/deviations: Step-through pattern     General Gait Details: Cues for safety with RW to stay close to device, pt uses rollator at home so had difficulty adjusting.   Stairs Stairs: (educated on sequencing but patient declined need for stair training.)           Wheelchair Mobility    Modified Rankin (Stroke Patients Only)       Balance Overall balance assessment: Needs assistance Sitting-balance support: No upper extremity supported;Feet supported Sitting balance-Leahy Scale: Good       Standing balance-Leahy Scale: Poor                               Cognition Arousal/Alertness: Awake/alert Behavior During Therapy: WFL for tasks assessed/performed Overall Cognitive Status: Within Functional Limits for tasks assessed                                        Exercises      General Comments        Pertinent Vitals/Pain Pain Assessment: Faces Faces Pain Scale: Hurts a little bit Pain Location: L knee Pain Descriptors / Indicators: Discomfort Pain Intervention(s): Monitored during session    Home Living                      Prior Function            PT Goals (current goals can now be found in the care plan section) Acute Rehab PT Goals Patient Stated Goal: to go home Progress towards PT goals: Progressing toward goals    Frequency    Min 3X/week      PT Plan Discharge plan needs to be updated    Co-evaluation              AM-PAC PT "6 Clicks" Mobility   Outcome Measure  Help needed turning from your back to your side while in a flat bed without using  bedrails?: A Little Help needed moving from lying on your back to sitting on the side of a flat bed without using bedrails?: A Little Help needed moving to and from a bed to a chair (including a wheelchair)?: A Little Help needed standing up from a chair using your arms (e.g., wheelchair or bedside chair)?: A Little Help needed to walk in hospital room?: A Little Help needed climbing 3-5 steps with a railing? : A Little 6 Click Score: 18    End of Session   Activity Tolerance: Patient tolerated treatment well Patient left: in chair;with call bell/phone within reach;with family/visitor present Nurse Communication: Mobility status PT Visit Diagnosis: Muscle weakness (generalized) (M62.81);Difficulty in walking, not elsewhere classified (R26.2);Pain;History of falling (Z91.81) Pain - Right/Left: Left Pain - part of body: Knee     Time: 5945-8592 PT Time Calculation (min) (ACUTE ONLY): 9 min  Charges:   $Gait Training: 8-22 mins                     Governor Rooks, PTA Acute Rehabilitation Services Pager [phone] Office [phone]     Gail Page Gail Page 09/02/2018, 3:38 PM

## 2018-09-02 NOTE — Progress Notes (Addendum)
Daughter called to check on patient. Would like to speak with MDs about pt's Hgb. Plans to visit later today.  11 - spoke with daughter and son in pt's room (daughter here in person, son on daughter's tablet). Discussed concerns about hemoglobin - able to educate patient and family about normal ranges and what is expected in the hospital. I explained that pt's level has been stable with no signs of bleeding and that plans are for her to follow up with PCP. They all seemed to be relieved by this. We discussed follow up on the C.diff infection, necessity of finishing courses of antibiotics, probiotics pt could take, and use of voltaren gel. Pt reports that her LEFT knee feels much better today. Awaiting discharge orders.

## 2018-09-02 NOTE — Discharge Summary (Signed)
Physician Discharge Summary  Gail Page ZOX:[id number] DOB: [birth year] DOA: 08/28/2018  PCP: System, Pcp Not In  Admit date: 08/28/2018 Discharge date: 09/02/2018  Admitted From: Home Disposition: Home  Recommendations for Outpatient Follow-up:  1. Follow up with PCP in 1 week 2. Please obtain BMP/CBC in one week 3. Please follow up on the following pending results: None  Home Health: PT Equipment/Devices: None  Discharge Condition: Stable CODE STATUS: DNR Diet recommendation: Regular diet   Brief/Interim Summary:  Admission HPI written by Jonah Blue, MD   Chief Complaint: leg problem  HPI: Gail Page is a 85 y.o. female with medical history significant of CAD (2017); hypertension, Type2 Diabetes, hyperlipidemia , chronic renal insufficiency degenerative joint disease, history of CVA, renal insufficiency presenting with fever.  She was previously admitted after a fall 7/12, discharged on 7/17.  Developed fever with T max 101.5 on 7/19, last fever was on Friday and was 99.7.  Her leg was swollen bruised from the fall and she has chronic B L > R LE lymphedema.  The last 2 days, she has bene complaining of RLE lateral lower pain.  The swelling in her feet would not go down last night and was still swollen this AM.  Daughter was concerned about a DVT vs. Cellulitis.  BP has been well controlled.  The diarrhea started during her last hospitalization.  They saw her PCP and was tested for COVID and placed on a BRAT diet.  She started back on a regular diet.  HHN mentioned concern for C diff - malodorous, green, mucus; has not been tested.  On Lasix when she gains ?2 pounds in a day, none recently.  Has had some vertigo, fatigue.   Hospital course:  Right lower extremity cellulitis Patient started on IV vancomycin and Levaquin on admission and transitioned to doxycycline. Cellulitis significantly improved. Discharged to complete Doxycycline.  C. Difficile diarrhea Patient  started on PO Vancomycin with plans for 2 weeks. Discharged with Vancomycin capsules. Discontinue Protonix.  LLL opacity Low suspicion for pneumonia. Recommend outpatient repeat chest x-ray  Anemia Appears to be chronic. Stable while inpatient. Outpatient follow-up  Chronic diastolic heart failure Stable.  Diabetes mellitus, type 2 Diet controlled. Hemoglobin A1C of 5.1%.  Right thigh cyst Incidental finding. Outpatient follow-up.  Left knee pain Likely secondary to arthritis. Improved with arthrocentesis and steroid injection. Voltaren gel.   Discharge Diagnoses:  Principal Problem:   Cellulitis of leg, right Active Problems:   Diarrhea   Opacity of lung on imaging study   Anemia of chronic disease   Chronic diastolic CHF (congestive heart failure) (HCC)   Essential hypertension   CAD (coronary artery disease)   Diabetes mellitus type 2, diet-controlled (HCC)   CKD (chronic kidney disease) stage 3, GFR 30-59 ml/min (HCC)    Discharge Instructions   Allergies as of 09/02/2018      Reactions   Penicillins Anaphylaxis   Did it involve swelling of the face/tongue/throat, SOB, or low BP? Yes Did it involve sudden or severe rash/hives, skin peeling, or any reaction on the inside of your mouth or nose? No Did you need to seek medical attention at a hospital or doctor's office? No When did it last happen? when 85 years old If all above answers are NO, may proceed with cephalosporin use.   Latex Other (See Comments)   Fluid coming out   Pork-derived Products    Pt does not eat pork. I spoke with patient - she is ok  with heparin.      Medication List    STOP taking these medications   pantoprazole 40 MG tablet Commonly known as: PROTONIX     TAKE these medications   acetaminophen 500 MG tablet Commonly known as: TYLENOL Take 500-1,000 mg by mouth every 6 (six) hours as needed for mild pain or moderate pain.   atorvastatin 40 MG tablet Commonly  known as: LIPITOR Take 40 mg by mouth at bedtime.   b complex vitamins tablet Take 1 tablet by mouth daily.   calcium-vitamin D 500-200 MG-UNIT tablet Commonly known as: OSCAL WITH D Take 1 tablet by mouth every morning.   cholecalciferol 25 MCG (1000 UT) tablet Commonly known as: VITAMIN D Take 1,000 Units by mouth daily.   clopidogrel 75 MG tablet Commonly known as: PLAVIX Take 75 mg by mouth daily.   diclofenac sodium 1 % Gel Commonly known as: VOLTAREN Apply 4 g topically 4 (four) times daily.   doxycycline 100 MG tablet Commonly known as: VIBRA-TABS Take 1 tablet (100 mg total) by mouth every 12 (twelve) hours for 1 day.   ferrous sulfate 300 (60 Fe) MG/5ML syrup Take 300 mg by mouth daily.   fluticasone 50 MCG/ACT nasal spray Commonly known as: FLONASE Place 1 spray into both nostrils daily as needed for allergies or rhinitis.   furosemide 20 MG tablet Commonly known as: LASIX Take 20 mg by mouth daily as needed (weight gain >2).   metoprolol succinate 25 MG 24 hr tablet Commonly known as: TOPROL-XL Take 12.5 mg by mouth daily.   vancomycin 50 mg/mL  oral solution Commonly known as: VANCOCIN Take 2.5 mLs (125 mg total) by mouth 4 (four) times daily for 9 days.   vitamin B-12 1000 MCG tablet Commonly known as: CYANOCOBALAMIN Take 1,000 mcg by mouth daily.      Follow-up Information    [facility].   Specialty: Home Health Services Why: home health services arranged Contact information: [address] [place] [id number] [phone]        Cammy Copaean, Gregory Scott, MD. Schedule an appointment as soon as possible for a visit.   Specialty: Orthopedic Surgery Contact information: [address] [place] [address] [phone]          Allergies  Allergen Reactions   Penicillins Anaphylaxis    Did it involve swelling of the face/tongue/throat, SOB, or low BP? Yes Did it involve sudden or severe  rash/hives, skin peeling, or any reaction on the inside of your mouth or nose? No Did you need to seek medical attention at a hospital or doctor's office? No When did it last happen? when 85 years old If all above answers are NO, may proceed with cephalosporin use.    Latex Other (See Comments)    Fluid coming out   Pork-Derived Products     Pt does not eat pork. I spoke with patient - she is ok with heparin.    Consultations:  Orthopedic surgery   Procedures/Studies: Dg Chest Port 1 View  Result Date: 08/28/2018 CLINICAL DATA:  Leg pain and swelling. Fever today. Diarrhea for 20 days. Fall on 08/07/2018. EXAM: PORTABLE CHEST 1 VIEW COMPARISON:  None. FINDINGS: Cardiac silhouette is normal in size. No mediastinal or hilar masses or evidence of adenopathy. There is opacity at the left lung base. Milder opacity is noted at the right lung base which is likely due to a combination of atelectasis and contour lobulation of  the hemidiaphragm. Remainder of the lungs show prominent bronchovascular markings, but are otherwise clear. Possible small effusions, particularly on the left. No pneumothorax. Skeletal structures are grossly intact. IMPRESSION: 1. Left lung base opacity consistent with pneumonia or atelectasis. A mass is also possible. Probable small associated pleural effusion. Consider follow-up chest CT with contrast for further assessment. 2. Mild atelectasis or, less likely, infiltrate at the right lung base. 3. Lung hyperexpansion consistent with underlying COPD. Electronically Signed   By: Amie Portlandavid  Ormond M.D.   On: 08/28/2018 10:09   Dg Knee Left Port  Result Date: 09/01/2018 CLINICAL DATA:  Effusion. EXAM: PORTABLE LEFT KNEE - 1-2 VIEW COMPARISON:  None. FINDINGS: Moderate to severe tricompartmental degenerative changes are identified. A moderate to large suprapatellar joint effusion is identified. No fracture or dislocation. IMPRESSION: Moderate to large suprapatellar joint  effusion. Moderate to severe degenerative changes. No other acute abnormalities. Electronically Signed   By: Gerome Samavid  Williams III M.D   On: 09/01/2018 14:37   Vas Koreas Lower Extremity Venous (dvt)  Result Date: 08/29/2018  Lower Venous Study Indications: Swelling.  Risk Factors: Trauma. Limitations: Body habitus and poor ultrasound/tissue interface. Comparison Study: No prior studies. Performing Technologist: Chanda BusingGregory Collins RVT  Examination Guidelines: A complete evaluation includes B-mode imaging, spectral Doppler, color Doppler, and power Doppler as needed of all accessible portions of each vessel. Bilateral testing is considered an integral part of a complete examination. Limited examinations for reoccurring indications may be performed as noted.  +---------+---------------+---------+-----------+----------+-------------------+  RIGHT     Compressibility Phasicity Spontaneity Properties Summary              +---------+---------------+---------+-----------+----------+-------------------+  CFV       Full            Yes       Yes                                         +---------+---------------+---------+-----------+----------+-------------------+  SFJ       Full                                                                  +---------+---------------+---------+-----------+----------+-------------------+  FV Prox   Full                                                                  +---------+---------------+---------+-----------+----------+-------------------+  FV Mid                    Yes       Yes                                         +---------+---------------+---------+-----------+----------+-------------------+  FV Distal                 Yes       Yes                                         +---------+---------------+---------+-----------+----------+-------------------+  PFV       Full                                                                   +---------+---------------+---------+-----------+----------+-------------------+  POP       Full            Yes       Yes                                         +---------+---------------+---------+-----------+----------+-------------------+  PTV       Full                                                                  +---------+---------------+---------+-----------+----------+-------------------+  PERO                                                       Patency shown with                                                               color doppler        +---------+---------------+---------+-----------+----------+-------------------+  Gastroc   Full                                                                  +---------+---------------+---------+-----------+----------+-------------------+   +----+---------------+---------+-----------+----------+-------+  LEFT Compressibility Phasicity Spontaneity Properties Summary  +----+---------------+---------+-----------+----------+-------+  CFV  Full            Yes       Yes                             +----+---------------+---------+-----------+----------+-------+     Summary: Right: There is no evidence of deep vein thrombosis in the lower extremity. A cystic structure is found in the popliteal fossa. A fluid collection is noted in the distal thigh. The fluid collection extends from the distal anterior thigh into the distal posterior thigh. Left: No evidence of common femoral vein obstruction.  *See table(s) above for measurements and observations. Electronically signed by Fabienne Brunsharles Fields MD on 08/29/2018 at 4:03:10 PM.    Final       Subjective: No issues overnight.  Discharge Exam: Vitals:   09/01/18 2141 09/02/18 0548  BP: 133/66 140/67  Pulse: 72 73  Resp: 18 18  Temp:  98.3 F (36.8 C) 97.9 F (36.6 C)  SpO2: 94% 93%   Vitals:   08/31/18 2149 09/01/18 1425 09/01/18 2141 09/02/18 0548  BP: (!) 128/57 132/60 133/66 140/67  Pulse: 78 74 72 73    Resp:  18 18 18   Temp: 99 F (37.2 C) 98.6 F (37 C) 98.3 F (36.8 C) 97.9 F (36.6 C)  TempSrc: Oral Oral    SpO2: 97% 98% 94% 93%  Weight:      Height:        General: Pt is alert, awake, not in acute distress Cardiovascular: RRR, S1/S2 +, no rubs, no gallops Respiratory: CTA bilaterally, no wheezing, no rhonchi Abdominal: Soft, NT, ND, bowel sounds + Extremities: Bilateral edema with no erythema, no cyanosis    The results of significant diagnostics from this hospitalization (including imaging, microbiology, ancillary and laboratory) are listed below for reference.     Microbiology: Recent Results (from the past 240 hour(s))  SARS Coronavirus 2 [facility] order, Performed in [facility] hospital lab) Nasopharyngeal Nasopharyngeal Swab     Status: None   Collection Time: 08/28/18  8:02 AM   Specimen: Nasopharyngeal Swab  Result Value Ref Range Status   SARS Coronavirus 2 NEGATIVE NEGATIVE Final    Comment: (NOTE) If result is NEGATIVE SARS-CoV-2 target nucleic acids are NOT DETECTED. The SARS-CoV-2 RNA is generally detectable in upper and lower  respiratory specimens during the acute phase of infection. The lowest  concentration of SARS-CoV-2 viral copies this assay can detect is 250  copies / mL. A negative result does not preclude SARS-CoV-2 infection  and should not be used as the sole basis for treatment or other  patient management decisions.  A negative result may occur with  improper specimen collection / handling, submission of specimen other  than nasopharyngeal swab, presence of viral mutation(s) within the  areas targeted by this assay, and inadequate number of viral copies  (<250 copies / mL). A negative result must be combined with clinical  observations, patient history, and epidemiological information. If result is POSITIVE SARS-CoV-2 target nucleic acids are DETECTED. The SARS-CoV-2 RNA is generally detectable in upper and lower  respiratory specimens  dur ing the acute phase of infection.  Positive  results are indicative of active infection with SARS-CoV-2.  Clinical  correlation with patient history and other diagnostic information is  necessary to determine patient infection status.  Positive results do  not rule out bacterial infection or co-infection with other viruses. If result is PRESUMPTIVE POSTIVE SARS-CoV-2 nucleic acids MAY BE PRESENT.   A presumptive positive result was obtained on the submitted specimen  and confirmed on repeat testing.  While 2019 novel coronavirus  (SARS-CoV-2) nucleic acids may be present in the submitted sample  additional confirmatory testing may be necessary for epidemiological  and / or clinical management purposes  to differentiate between  SARS-CoV-2 and other Sarbecovirus currently known to infect humans.  If clinically indicated additional testing with an alternate test  methodology [phone]) is advised. The SARS-CoV-2 RNA is generally  detectable in upper and lower respiratory sp ecimens during the acute  phase of infection. The expected result is Negative. Fact Sheet for Patients:  BoilerBrush.com.cy Fact Sheet for Healthcare Providers: https://pope.com/ This test is not yet approved or cleared by the [place] FDA and has been authorized for detection and/or diagnosis of SARS-CoV-2 by FDA under an Emergency Use Authorization (EUA).  This EUA will remain in effect (meaning this test can be used) for the  duration of the COVID-19 declaration under Section 564(b)(1) of the Act, 21 U.S.C. section 360bbb-3(b)(1), unless the authorization is terminated or revoked sooner. Performed at [facility], [address], [place], [place] [address]   Urine culture     Status: Abnormal   Collection Time: 08/28/18 11:43 AM   Specimen: In/Out Cath Urine  Result Value Ref Range Status   Specimen Description IN/OUT CATH URINE  Final   Special  Requests NONE  Final   Culture (A)  Final    >=100,000 COLONIES/mL LACTOBACILLUS SPECIES Standardized susceptibility testing for this organism is not available. Performed at [facility], [address], [place], [place] [address]    Report Status 08/30/2018 FINAL  Final  Blood culture (routine x 2)     Status: Abnormal   Collection Time: 08/28/18 12:33 PM   Specimen: BLOOD  Result Value Ref Range Status   Specimen Description BLOOD SITE NOT SPECIFIED  Final   Special Requests   Final    BOTTLES DRAWN AEROBIC AND ANAEROBIC Blood Culture adequate volume   Culture  Setup Time   Final    GRAM POSITIVE COCCI AEROBIC BOTTLE ONLY CRITICAL RESULT CALLED TO, READ BACK BY AND VERIFIED WITH: Norva Riffle PharmD 11:35 08/29/18 ([email or web address])    Culture (A)  Final    STAPHYLOCOCCUS SPECIES (COAGULASE NEGATIVE) THE SIGNIFICANCE OF ISOLATING THIS ORGANISM FROM A SINGLE SET OF BLOOD CULTURES WHEN MULTIPLE SETS ARE DRAWN IS UNCERTAIN. PLEASE NOTIFY THE MICROBIOLOGY DEPARTMENT WITHIN ONE WEEK IF SPECIATION AND SENSITIVITIES ARE REQUIRED. Performed at [facility], [address], [place], [place] [address]    Report Status 08/30/2018 FINAL  Final  Blood Culture ID Panel (Reflexed)     Status: Abnormal   Collection Time: 08/28/18 12:33 PM  Result Value Ref Range Status   Enterococcus species NOT DETECTED NOT DETECTED Final   Listeria monocytogenes NOT DETECTED NOT DETECTED Final   Staphylococcus species DETECTED (A) NOT DETECTED Final    Comment: Methicillin (oxacillin) susceptible coagulase negative staphylococcus. Possible blood culture contaminant (unless isolated from more than one blood culture draw or clinical case suggests pathogenicity). No antibiotic treatment is indicated for blood  culture contaminants. CRITICAL RESULT CALLED TO, READ BACK BY AND VERIFIED WITH: Norva Riffle PharmD 11:35 08/29/18 ([email or web address])    Staphylococcus aureus (BCID) NOT DETECTED NOT DETECTED Final   Methicillin  resistance NOT DETECTED NOT DETECTED Final   Streptococcus species NOT DETECTED NOT DETECTED Final   Streptococcus agalactiae NOT DETECTED NOT DETECTED Final   Streptococcus pneumoniae NOT DETECTED NOT DETECTED Final   Streptococcus pyogenes NOT DETECTED NOT DETECTED Final   Acinetobacter baumannii NOT DETECTED NOT DETECTED Final   Enterobacteriaceae species NOT DETECTED NOT DETECTED Final   Enterobacter cloacae complex NOT DETECTED NOT DETECTED Final   Escherichia coli NOT DETECTED NOT DETECTED Final   Klebsiella oxytoca NOT DETECTED NOT DETECTED Final   Klebsiella pneumoniae NOT DETECTED NOT DETECTED Final   Proteus species NOT DETECTED NOT DETECTED Final   Serratia marcescens NOT DETECTED NOT DETECTED Final   Haemophilus influenzae NOT DETECTED NOT DETECTED Final   Neisseria meningitidis NOT DETECTED NOT DETECTED Final   Pseudomonas aeruginosa NOT DETECTED NOT DETECTED Final   Candida albicans NOT DETECTED NOT DETECTED Final   Candida glabrata NOT DETECTED NOT DETECTED Final   Candida krusei NOT DETECTED NOT DETECTED Final   Candida parapsilosis NOT DETECTED NOT DETECTED Final   Candida tropicalis NOT DETECTED NOT DETECTED Final    Comment:  Performed at [facility], [place] [address], [place], [place] [address]  Blood culture (routine x 2)     Status: None   Collection Time: 08/28/18  5:16 PM   Specimen: BLOOD RIGHT HAND  Result Value Ref Range Status   Specimen Description BLOOD RIGHT HAND  Final   Special Requests   Final    BOTTLES DRAWN AEROBIC ONLY Blood Culture adequate volume   Culture   Final    NO GROWTH 5 DAYS Performed at [facility], [place] [address], [place], [place] [address]    Report Status 09/02/2018 FINAL  Final  C difficile quick scan w PCR reflex     Status: Abnormal   Collection Time: 08/28/18  6:06 PM   Specimen: STOOL  Result Value Ref Range Status   C Diff antigen POSITIVE (A) NEGATIVE Final   C Diff toxin POSITIVE (A) NEGATIVE Final   C  Diff interpretation Toxin producing C. difficile detected.  Final    Comment: CRITICAL RESULT CALLED TO, READ BACK BY AND VERIFIED WITH: Tonita Cong RN [id number] 08/29/18 A BROWNING Performed at [facility], [place] [address] [place], [place] [address]   Body fluid culture     Status: None (Preliminary result)   Collection Time: 09/01/18  4:13 PM   Specimen: Synovium; Body Fluid  Result Value Ref Range Status   Specimen Description SYNOVIAL  Final   Special Requests KNEE  Final   Gram Stain   Final    WBC PRESENT, PREDOMINANTLY PMN NO ORGANISMS SEEN CYTOSPIN SMEAR    Culture   Final    NO GROWTH < 24 HOURS Performed at [place] [address], [place], [place] [address]    Report Status PENDING  Incomplete     Labs: BNP (last 3 results) No results for input(s): BNP in the last 8760 hours. Basic Metabolic Panel: Recent Labs  Lab 08/28/18 0830 08/29/18 0245 08/31/18 0249  NA 139 141 140  K 4.6 4.5 3.8  CL 105 110 105  CO2 28 27 28   GLUCOSE 115* 100* 111*  BUN 16 14 14   CREATININE 1.05* 1.04* 1.03*  CALCIUM 9.1 8.3* 8.3*  MG 1.9  --   --    Liver Function Tests: Recent Labs  Lab 08/28/18 0830  AST 32  ALT 24  ALKPHOS 59  BILITOT 1.3*  PROT 5.4*  ALBUMIN 2.6*   No results for input(s): LIPASE, AMYLASE in the last 168 hours. No results for input(s): AMMONIA in the last 168 hours. CBC: Recent Labs  Lab 08/28/18 0830 08/29/18 0245 08/31/18 0249  WBC 8.0 7.9 6.9  NEUTROABS  --   --  4.0  HGB 9.7* 8.4* 8.5*  HCT 31.5* 26.5* 26.8*  MCV 98.7 95.7 95.4  PLT 227 204 206   Cardiac Enzymes: No results for input(s): CKTOTAL, CKMB, CKMBINDEX, TROPONINI in the last 168 hours. BNP: Invalid input(s): POCBNP CBG: No results for input(s): GLUCAP in the last 168 hours. D-Dimer No results for input(s): DDIMER in the last 72 hours. Hgb A1c Recent Labs    08/31/18 0249  HGBA1C 5.1   Lipid Profile No results for input(s): CHOL, HDL, LDLCALC, TRIG,  CHOLHDL, LDLDIRECT in the last 72 hours. Thyroid function studies No results for input(s): TSH, T4TOTAL, T3FREE, THYROIDAB in the last 72 hours.  Invalid input(s): FREET3 Anemia work up No results for input(s): VITAMINB12, FOLATE, FERRITIN, TIBC, IRON, RETICCTPCT in the last 72 hours. Urinalysis  Component Value Date/Time   COLORURINE YELLOW 08/28/2018 1522   APPEARANCEUR CLEAR 08/28/2018 1522   LABSPEC 1.005 08/28/2018 1522   PHURINE 6.0 08/28/2018 1522   GLUCOSEU NEGATIVE 08/28/2018 1522   HGBUR SMALL (A) 08/28/2018 1522   BILIRUBINUR NEGATIVE 08/28/2018 1522   KETONESUR NEGATIVE 08/28/2018 1522   PROTEINUR NEGATIVE 08/28/2018 1522   NITRITE NEGATIVE 08/28/2018 1522   LEUKOCYTESUR NEGATIVE 08/28/2018 1522   Sepsis Labs Invalid input(s): PROCALCITONIN,  WBC,  LACTICIDVEN Microbiology Recent Results (from the past 240 hour(s))  SARS Coronavirus 2 [facility] order, Performed in [facility] hospital lab) Nasopharyngeal Nasopharyngeal Swab     Status: None   Collection Time: 08/28/18  8:02 AM   Specimen: Nasopharyngeal Swab  Result Value Ref Range Status   SARS Coronavirus 2 NEGATIVE NEGATIVE Final    Comment: (NOTE) If result is NEGATIVE SARS-CoV-2 target nucleic acids are NOT DETECTED. The SARS-CoV-2 RNA is generally detectable in upper and lower  respiratory specimens during the acute phase of infection. The lowest  concentration of SARS-CoV-2 viral copies this assay can detect is 250  copies / mL. A negative result does not preclude SARS-CoV-2 infection  and should not be used as the sole basis for treatment or other  patient management decisions.  A negative result may occur with  improper specimen collection / handling, submission of specimen other  than nasopharyngeal swab, presence of viral mutation(s) within the  areas targeted by this assay, and inadequate number of viral copies  (<250 copies / mL). A negative result must be combined with clinical  observations,  patient history, and epidemiological information. If result is POSITIVE SARS-CoV-2 target nucleic acids are DETECTED. The SARS-CoV-2 RNA is generally detectable in upper and lower  respiratory specimens dur ing the acute phase of infection.  Positive  results are indicative of active infection with SARS-CoV-2.  Clinical  correlation with patient history and other diagnostic information is  necessary to determine patient infection status.  Positive results do  not rule out bacterial infection or co-infection with other viruses. If result is PRESUMPTIVE POSTIVE SARS-CoV-2 nucleic acids MAY BE PRESENT.   A presumptive positive result was obtained on the submitted specimen  and confirmed on repeat testing.  While 2019 novel coronavirus  (SARS-CoV-2) nucleic acids may be present in the submitted sample  additional confirmatory testing may be necessary for epidemiological  and / or clinical management purposes  to differentiate between  SARS-CoV-2 and other Sarbecovirus currently known to infect humans.  If clinically indicated additional testing with an alternate test  methodology [phone]) is advised. The SARS-CoV-2 RNA is generally  detectable in upper and lower respiratory sp ecimens during the acute  phase of infection. The expected result is Negative. Fact Sheet for Patients:  BoilerBrush.com.cy Fact Sheet for Healthcare Providers: https://pope.com/ This test is not yet approved or cleared by the [place] FDA and has been authorized for detection and/or diagnosis of SARS-CoV-2 by FDA under an Emergency Use Authorization (EUA).  This EUA will remain in effect (meaning this test can be used) for the duration of the COVID-19 declaration under Section 564(b)(1) of the Act, 21 U.S.C. section 360bbb-3(b)(1), unless the authorization is terminated or revoked sooner. Performed at [facility], [address] [place],  [place] [address]   Urine culture     Status: Abnormal   Collection Time: 08/28/18 11:43 AM   Specimen: In/Out Cath Urine  Result Value Ref Range Status   Specimen Description IN/OUT CATH URINE  Final  Special Requests NONE  Final   Culture (A)  Final    >=100,000 COLONIES/mL LACTOBACILLUS SPECIES Standardized susceptibility testing for this organism is not available. Performed at [facility], [address] [place], [place] [address]    Report Status 08/30/2018 FINAL  Final  Blood culture (routine x 2)     Status: Abnormal   Collection Time: 08/28/18 12:33 PM   Specimen: BLOOD  Result Value Ref Range Status   Specimen Description BLOOD SITE NOT SPECIFIED  Final   Special Requests   Final    BOTTLES DRAWN AEROBIC AND ANAEROBIC Blood Culture adequate volume   Culture  Setup Time   Final    GRAM POSITIVE COCCI AEROBIC BOTTLE ONLY CRITICAL RESULT CALLED TO, READ BACK BY AND VERIFIED WITH: Norva Riffle PharmD 11:35 08/29/18 ([email or web address])    Culture (A)  Final    STAPHYLOCOCCUS SPECIES (COAGULASE NEGATIVE) THE SIGNIFICANCE OF ISOLATING THIS ORGANISM FROM A SINGLE SET OF BLOOD CULTURES WHEN MULTIPLE SETS ARE DRAWN IS UNCERTAIN. PLEASE NOTIFY THE MICROBIOLOGY DEPARTMENT WITHIN ONE WEEK IF SPECIATION AND SENSITIVITIES ARE REQUIRED. Performed at [facility], [address] [place], [place] [address]    Report Status 08/30/2018 FINAL  Final  Blood Culture ID Panel (Reflexed)     Status: Abnormal   Collection Time: 08/28/18 12:33 PM  Result Value Ref Range Status   Enterococcus species NOT DETECTED NOT DETECTED Final   Listeria monocytogenes NOT DETECTED NOT DETECTED Final   Staphylococcus species DETECTED (A) NOT DETECTED Final    Comment: Methicillin (oxacillin) susceptible coagulase negative staphylococcus. Possible blood culture contaminant (unless isolated from more than one blood culture draw or clinical case suggests pathogenicity). No antibiotic treatment is indicated for blood   culture contaminants. CRITICAL RESULT CALLED TO, READ BACK BY AND VERIFIED WITH: Norva Riffle PharmD 11:35 08/29/18 ([email or web address])    Staphylococcus aureus (BCID) NOT DETECTED NOT DETECTED Final   Methicillin resistance NOT DETECTED NOT DETECTED Final   Streptococcus species NOT DETECTED NOT DETECTED Final   Streptococcus agalactiae NOT DETECTED NOT DETECTED Final   Streptococcus pneumoniae NOT DETECTED NOT DETECTED Final   Streptococcus pyogenes NOT DETECTED NOT DETECTED Final   Acinetobacter baumannii NOT DETECTED NOT DETECTED Final   Enterobacteriaceae species NOT DETECTED NOT DETECTED Final   Enterobacter cloacae complex NOT DETECTED NOT DETECTED Final   Escherichia coli NOT DETECTED NOT DETECTED Final   Klebsiella oxytoca NOT DETECTED NOT DETECTED Final   Klebsiella pneumoniae NOT DETECTED NOT DETECTED Final   Proteus species NOT DETECTED NOT DETECTED Final   Serratia marcescens NOT DETECTED NOT DETECTED Final   Haemophilus influenzae NOT DETECTED NOT DETECTED Final   Neisseria meningitidis NOT DETECTED NOT DETECTED Final   Pseudomonas aeruginosa NOT DETECTED NOT DETECTED Final   Candida albicans NOT DETECTED NOT DETECTED Final   Candida glabrata NOT DETECTED NOT DETECTED Final   Candida krusei NOT DETECTED NOT DETECTED Final   Candida parapsilosis NOT DETECTED NOT DETECTED Final   Candida tropicalis NOT DETECTED NOT DETECTED Final    Comment: Performed at [facility], [address], [place], [place] [address]  Blood culture (routine x 2)     Status: None   Collection Time: 08/28/18  5:16 PM   Specimen: BLOOD RIGHT HAND  Result Value Ref Range Status   Specimen Description BLOOD RIGHT HAND  Final   Special Requests   Final    BOTTLES DRAWN AEROBIC ONLY Blood Culture adequate volume   Culture  Final    NO GROWTH 5 DAYS Performed at [facility], [address], [place], [place] [address]    Report Status 09/02/2018 FINAL  Final  C difficile quick scan w PCR reflex      Status: Abnormal   Collection Time: 08/28/18  6:06 PM   Specimen: STOOL  Result Value Ref Range Status   C Diff antigen POSITIVE (A) NEGATIVE Final   C Diff toxin POSITIVE (A) NEGATIVE Final   C Diff interpretation Toxin producing C. difficile detected.  Final    Comment: CRITICAL RESULT CALLED TO, READ BACK BY AND VERIFIED WITH: Leilani Able RN [id number] 08/29/18 A BROWNING Performed at [facility], [address], [place], [place] [address]   Body fluid culture     Status: None (Preliminary result)   Collection Time: 09/01/18  4:13 PM   Specimen: Synovium; Body Fluid  Result Value Ref Range Status   Specimen Description SYNOVIAL  Final   Special Requests KNEE  Final   Gram Stain   Final    WBC PRESENT, PREDOMINANTLY PMN NO ORGANISMS SEEN CYTOSPIN SMEAR    Culture   Final    NO GROWTH < 24 HOURS Performed at [facility], [address], [place], [place] [address]    Report Status PENDING  Incomplete     Time coordinating discharge: 35 minutes  SIGNED:   Jacquelin Hawking, MD [facility] 09/02/2018, 1:31 PM

## 2018-09-02 NOTE — Progress Notes (Signed)
Pt discharged to home. I removed her IV and held pressure. I went over AVS with her and her daughter. We discussed follow up, importance of taking antibiotics throughout whole course, and changes in medications. They asked questions. They already had the medication prescriptions filled. They are already in contact with the home health agency. Patient taken down to entrance in wheelchair, got into private transport in stable condition.

## 2018-09-05 LAB — BODY FLUID CULTURE: Culture: NO GROWTH

## 2018-09-10 ENCOUNTER — Encounter: Payer: Self-pay | Admitting: Emergency Medicine

## 2018-09-10 ENCOUNTER — Other Ambulatory Visit: Payer: Self-pay

## 2018-09-10 ENCOUNTER — Emergency Department (INDEPENDENT_AMBULATORY_CARE_PROVIDER_SITE_OTHER)
Admission: EM | Admit: 2018-09-10 | Discharge: 2018-09-10 | Disposition: A | Discharge status: Home / Self Care | Payer: Medicare Other | Source: Home / Self Care | Attending: Family Medicine | Admitting: Family Medicine

## 2018-09-10 DIAGNOSIS — L03115 Cellulitis of right lower limb: Secondary | ICD-10-CM | POA: Diagnosis not present

## 2018-09-10 MED ORDER — DOXYCYCLINE HYCLATE 100 MG PO CAPS: ORAL_CAPSULE | ORAL | 0 refills | Status: AC

## 2018-09-10 NOTE — ED Provider Notes (Signed)
[facility]    CSN: [id number] Arrival date & time: 09/10/18  1128     History   Chief Complaint Chief Complaint  Patient presents with  . Cellulitis    HPI Gail Page is a 85 y.o. female.   Patient had recently been discharged from hospital for treatment of right lower leg cellulitis, subsequently developing complication of C. Diff colitis.  DVT had been ruled out during her hospitalization.  She is presently finishing a course of vancomycin 125mg  QID.  Her diarrhea has resolved. Yesterday patient's daughter noticed recurrent mild erythema and warmth of patient's right lower leg and knee.  Patient denies fever, chest pain, shortness of breath.  Daughter and patient wish to avoid repeat hospitalization if possible, and request Rx for doxycycline until they can follow-up with patient's orthopedist in two days.  The history is provided by the patient and a relative.    Past Medical History:  Diagnosis Date  . Anemia   . CHF (congestive heart failure) (HCC)   . Coronary artery disease   . Diabetes mellitus without complication (HCC)   . Lymphadenopathy   . Neuropathy   . Renal disorder   . Stroke [facility](HCC)     Patient Active Problem List   Diagnosis Date Noted  . Cellulitis of leg, right 08/28/2018  . Diarrhea 08/28/2018  . Opacity of lung on imaging study 08/28/2018  . Anemia of chronic disease 08/28/2018  . Chronic diastolic CHF (congestive heart failure) (HCC) 08/28/2018  . Essential hypertension 08/28/2018  . CAD (coronary artery disease) 08/28/2018  . Diabetes mellitus type 2, diet-controlled (HCC) 08/28/2018  . CKD (chronic kidney disease) stage 3, GFR 30-59 ml/min (HCC) 08/28/2018    Past Surgical History:  Procedure Laterality Date  . CHOLECYSTECTOMY    . TONSILLECTOMY    . TOTAL HIP ARTHROPLASTY      OB History   No obstetric history on file.      Home Medications    Prior to Admission medications   Medication Sig Start Date End Date  Taking? Authorizing Provider  acetaminophen (TYLENOL) 500 MG tablet Take 500-1,000 mg by mouth every 6 (six) hours as needed for mild pain or moderate pain.    [provider]  atorvastatin (LIPITOR) 40 MG tablet Take 40 mg by mouth at bedtime. 06/21/18   [provider]  b complex vitamins tablet Take 1 tablet by mouth daily.    [provider]  calcium-vitamin D (OSCAL WITH D) 500-200 MG-UNIT tablet Take 1 tablet by mouth every morning.    [provider]  cholecalciferol (VITAMIN D) 25 MCG (1000 UT) tablet Take 1,000 Units by mouth daily.    [provider]  clopidogrel (PLAVIX) 75 MG tablet Take 75 mg by mouth daily.    [provider]  diclofenac sodium (VOLTAREN) 1 % GEL Apply 4 g topically 4 (four) times daily. 09/02/18   Narda BondsNettey, Ralph A, MD  doxycycline (VIBRAMYCIN) 100 MG capsule Take one cap PO Q12 hours with food. 09/10/18   Lattie HawBeese, Stephen A, MD  ferrous sulfate 300 (60 Fe) MG/5ML syrup Take 300 mg by mouth daily.    [provider]  fluticasone (FLONASE) 50 MCG/ACT nasal spray Place 1 spray into both nostrils daily as needed for allergies or rhinitis.    [provider]  furosemide (LASIX) 20 MG tablet Take 20 mg by mouth daily as needed (weight gain >2).    [provider]  metoprolol succinate (TOPROL-XL) 25 MG 24  hr tablet Take 12.5 mg by mouth daily.    [provider]  vancomycin (VANCOCIN) 125 MG capsule Take 1 capsule (125 mg total) by mouth 4 (four) times daily. 09/02/18   Narda BondsNettey, Ralph A, MD  vitamin B-12 (CYANOCOBALAMIN) 1000 MCG tablet Take 1,000 mcg by mouth daily.    [provider]    Family History No family history on file.  Social History Social History   Tobacco Use  . Smoking status: Never Smoker  . Smokeless tobacco: Never Used  Substance Use Topics  . Alcohol use: Not Currently    Frequency: Never    Comment: rare  . Drug use: Never     Allergies    Penicillins, Latex, and Pork-derived products   Review of Systems Review of Systems  Constitutional: Positive for fatigue. Negative for appetite change, chills, diaphoresis and fever.  HENT: Negative.   Eyes: Negative.   Respiratory: Negative for cough, chest tightness and shortness of breath.   Cardiovascular: Positive for leg swelling. Negative for chest pain.  Gastrointestinal: Negative for diarrhea.  Genitourinary: Negative.   Skin: Positive for color change.  Neurological: Negative for headaches.     Physical Exam Triage Vital Signs ED Triage Vitals  Enc Vitals Group     BP 09/10/18 1252 132/75     Pulse Rate 09/10/18 1252 66     Resp 09/10/18 1252 16     Temp 09/10/18 1252 98.7 F (37.1 C)     Temp Source 09/10/18 1252 Oral     SpO2 09/10/18 1252 95 %     Weight 09/10/18 1254 196 lb (88.9 kg)     Height 09/10/18 1254 5\' 4"  (1.626 m)     Head Circumference --      Peak Flow --      Pain Score 09/10/18 1253 0     Pain Loc --      Pain Edu? --      Excl. in GC? --    No data found.  Updated Vital Signs BP 132/75 (BP Location: Right Arm)   Pulse 66   Temp 98.7 F (37.1 C) (Oral)   Resp 16   Ht 5\' 4"  (1.626 m)   Wt 88.9 kg   SpO2 95%   BMI 33.64 kg/m   Visual Acuity Right Eye Distance:   Left Eye Distance:   Bilateral Distance:    Right Eye Near:   Left Eye Near:    Bilateral Near:     Physical Exam Constitutional:      General: She is not in acute distress. HENT:     Head: Normocephalic.     Mouth/Throat:     Mouth: Mucous membranes are moist.  Eyes:     Pupils: Pupils are equal, round, and reactive to light.  Neck:     Musculoskeletal: Neck supple.  Cardiovascular:     Heart sounds: Normal heart sounds.  Pulmonary:     Breath sounds: Normal breath sounds.  Musculoskeletal:     Right lower leg: Edema present.     Left lower leg: She exhibits tenderness. Edema present.       Legs:     Comments: Right lower leg has mild erythema and  tenderness over pre-tibial area without posterior calf tenderness. Right knee is also mildly swollen without tenderness  Skin:    General: Skin is warm and dry.  Neurological:     Mental Status: She is alert.      UC Treatments / Results  Labs (all labs ordered are listed, but only abnormal results are displayed) Labs Reviewed - No data to display  EKG   Radiology No results found.  Procedures Procedures (including critical care time)  Medications Ordered in UC Medications - No data to display  Initial Impression / Assessment and Plan / UC Course  I have reviewed the triage vital signs and the nursing notes.  Pertinent labs & imaging results that were available during my care of the patient were reviewed by me and considered in my medical decision making (see chart for details).    Begin doxycycline 100mg  Q12hr (#14, no ref) Followup with surgeon in 2 days as scheduled.   Final Clinical Impressions(s) / UC Diagnoses   Final diagnoses:  Cellulitis of right lower leg     Discharge Instructions     Elevate right leg as much as possible.  If symptoms become significantly worse during the night or over the weekend, proceed to the local emergency room.     ED Prescriptions    Medication Sig Dispense Auth. Provider   doxycycline (VIBRAMYCIN) 100 MG capsule Take one cap PO Q12 hours with food. 14 capsule Gail Nicolas, MD        Gail Nicolas, MD 09/12/18 2031

## 2018-09-10 NOTE — ED Triage Notes (Addendum)
Patient has chronic lymphadenopathy with gross edema both legs; yesterday daughter noticed redness and heat on right lower leg and knee. Patient has been hospitalized recently and they are hoping to avoid another hospitalization. Had c.diff and is on final doses of vancomycin; no further diarrhea. She has not travelled past 4 weeks. Daughter providing care.

## 2018-09-10 NOTE — ED Triage Notes (Signed)
Patient has lasix to take if weight increases 2 or more pounds; as of this morning there was no weight gain and lasix not given.

## 2018-09-10 NOTE — Discharge Instructions (Addendum)
Elevate right leg as much as possible.  If symptoms become significantly worse during the night or over the weekend, proceed to the local emergency room.

## 2018-09-12 ENCOUNTER — Telehealth: Payer: Self-pay

## 2018-09-12 ENCOUNTER — Encounter: Payer: Self-pay | Admitting: Orthopedic Surgery

## 2018-09-12 ENCOUNTER — Ambulatory Visit (INDEPENDENT_AMBULATORY_CARE_PROVIDER_SITE_OTHER): Payer: Medicare Other | Admitting: Orthopedic Surgery

## 2018-09-12 DIAGNOSIS — I872 Venous insufficiency (chronic) (peripheral): Secondary | ICD-10-CM

## 2018-09-12 DIAGNOSIS — I890 Lymphedema, not elsewhere classified: Secondary | ICD-10-CM | POA: Diagnosis not present

## 2018-09-12 NOTE — Telephone Encounter (Signed)
Left message on VM with contact information.

## 2018-09-13 ENCOUNTER — Encounter: Payer: Self-pay | Admitting: Orthopedic Surgery

## 2018-09-13 ENCOUNTER — Ambulatory Visit (INDEPENDENT_AMBULATORY_CARE_PROVIDER_SITE_OTHER): Payer: Medicare Other | Admitting: Orthopedic Surgery

## 2018-09-13 VITALS — Ht 64.0 in | Wt 196.0 lb

## 2018-09-13 DIAGNOSIS — I890 Lymphedema, not elsewhere classified: Secondary | ICD-10-CM

## 2018-09-13 DIAGNOSIS — I872 Venous insufficiency (chronic) (peripheral): Secondary | ICD-10-CM | POA: Diagnosis not present

## 2018-09-13 NOTE — Progress Notes (Signed)
Office Visit Note   Patient: Gail CitizenYvonne Doenges           Date of Birth: 05/27/1930           MRN: [id number] Visit Date: 09/13/2018              Requested by: No referring provider defined for this encounter. PCP: System, Pcp Not In  Chief Complaint  Patient presents with  . Left Leg - Follow-up  . Right Leg - Follow-up      HPI: Patient presents 1 day status post wrapping both lower extremities with a Dynaflex compression wrap.  Patient complains of the right dressing sliding down to the top of the dressing is torn and unraveled.  Assessment & Plan: Visit Diagnoses:  1. Venous stasis dermatitis of both lower extremities   2. Lymphedema of both lower extremities     Plan: We will apply the paste to try to keep the dressing from sliding down Dynaflex wrap on top reevaluate on Monday.  The importance of elevation was discussed to keep the swelling down.  Follow-Up Instructions: Return in about 1 week (around 09/20/2018).   Ortho Exam  Patient is alert, oriented, no adenopathy, well-dressed, normal affect, normal respiratory effort. Examination patient's labs are pushed down and the top is shredded from that duration.  The skin has no defects or ulcerations.  We will reapply a paste and Dynaflex wrap for both lower extremities.  Imaging: No results found. No images are attached to the encounter.  Labs: Lab Results  Component Value Date   HGBA1C 5.1 08/31/2018   REPTSTATUS 09/05/2018 FINAL 09/01/2018   GRAMSTAIN  09/01/2018    WBC PRESENT, PREDOMINANTLY PMN NO ORGANISMS SEEN CYTOSPIN SMEAR    CULT  09/01/2018    NO GROWTH 3 DAYS Performed at [facility], [address] [place], [place] [id number]      Lab Results  Component Value Date   ALBUMIN 2.6 (L) 08/28/2018    Lab Results  Component Value Date   MG 1.9 08/28/2018   No results found for: VD25OH  No results found for: PREALBUMIN CBC EXTENDED Latest Ref Rng & Units 08/31/2018 08/29/2018 08/28/2018   WBC 4.0 - 10.5 K/uL 6.9 7.9 8.0  RBC 3.87 - 5.11 MIL/uL 2.81(L) 2.77(L) 3.19(L)  HGB 12.0 - 15.0 g/dL 1.9(J8.5(L) 4.7(W8.4(L) 2.9(F9.7(L)  HCT 36.0 - 46.0 % 26.8(L) 26.5(L) 31.5(L)  PLT 150 - 400 K/uL 206 204 227  NEUTROABS 1.7 - 7.7 K/uL 4.0 - -  LYMPHSABS 0.7 - 4.0 K/uL 1.6 - -     Body mass index is 33.64 kg/m.  Orders:  No orders of the defined types were placed in this encounter.  No orders of the defined types were placed in this encounter.    Procedures: No procedures performed  Clinical Data: No additional findings.  ROS:  All other systems negative, except as noted in the HPI. Review of Systems  Objective: Vital Signs: Ht 5\' 4"  (1.626 m)   Wt 196 lb (88.9 kg)   BMI 33.64 kg/m   Specialty Comments:  No specialty comments available.  PMFS History: Patient Active Problem List   Diagnosis Date Noted  . Cellulitis of leg, right 08/28/2018  . Diarrhea 08/28/2018  . Opacity of lung on imaging study 08/28/2018  . Anemia of chronic disease 08/28/2018  . Chronic diastolic CHF (congestive heart failure) (HCC) 08/28/2018  . Essential hypertension 08/28/2018  . CAD (coronary artery disease) 08/28/2018  . Diabetes mellitus type  2, diet-controlled ([place]) 08/28/2018  . CKD (chronic kidney disease) stage 3, GFR 30-59 ml/min ([place]) 08/28/2018   Past Medical History:  Diagnosis Date  . Anemia   . CHF (congestive heart failure) ([place])   . Coronary artery disease   . Diabetes mellitus without complication ([place])   . Lymphadenopathy   . Neuropathy   . Renal disorder   . Stroke [facility])     History reviewed. No pertinent family history.  Past Surgical History:  Procedure Laterality Date  . CHOLECYSTECTOMY    . TONSILLECTOMY    . TOTAL HIP ARTHROPLASTY     Social History   Occupational History  . Occupation: retired  Tobacco Use  . Smoking status: Never Smoker  . Smokeless tobacco: Never Used  Substance and Sexual Activity  . Alcohol use: Not Currently    Frequency: Never     Comment: rare  . Drug use: Never  . Sexual activity: Not on file

## 2018-09-13 NOTE — Progress Notes (Signed)
Office Visit Note   Patient: Gail Page           Date of Birth: 1931/01/12           MRN: [id number] Visit Date: 09/12/2018              Requested by: No referring provider defined for this encounter. PCP: System, Pcp Not In  Chief Complaint  Patient presents with  . Right Knee - Pain  . Left Knee - Pain      HPI: Patient is an 85 year old woman who is seen in consultation for Dr. Marlou Sa for swelling of both lower extremities.  Patient complains of swelling with dependency.  Assessment & Plan: Visit Diagnoses:  1. Venous stasis dermatitis of both lower extremities   2. Lymphedema of both lower extremities     Plan: We will have both lower legs wrapped with a Dynaflex wrap follow-up in the office next week once we get significant resolution of the swelling we will start her compression stockings.  Follow-Up Instructions: Return in about 1 week (around 09/19/2018).   Ortho Exam  Patient is alert, oriented, no adenopathy, well-dressed, normal affect, normal respiratory effort. Examination patient has brawny edema of both lower extremities the right calf measures 48 cm in circumference left calf is 37 cm she has both lymphedema and venous insufficiency.  She has significant swelling around the ankles as well.  There is no cellulitis no odor no drainage no open wounds no signs of infection.  Imaging: No results found. No images are attached to the encounter.  Labs: Lab Results  Component Value Date   HGBA1C 5.1 08/31/2018   REPTSTATUS 09/05/2018 FINAL 09/01/2018   GRAMSTAIN  09/01/2018    WBC PRESENT, PREDOMINANTLY PMN NO ORGANISMS SEEN CYTOSPIN SMEAR    CULT  09/01/2018    NO GROWTH 3 DAYS Performed at [facility], [place] [address], [place], [place] [address]      Lab Results  Component Value Date   ALBUMIN 2.6 (L) 08/28/2018    Lab Results  Component Value Date   MG 1.9 08/28/2018   No results found for: VD25OH  No results found for: PREALBUMIN  CBC EXTENDED Latest Ref Rng & Units 08/31/2018 08/29/2018 08/28/2018  WBC 4.0 - 10.5 K/uL 6.9 7.9 8.0  RBC 3.87 - 5.11 MIL/uL 2.81(L) 2.77(L) 3.19(L)  HGB 12.0 - 15.0 g/dL 8.5(L) 8.4(L) 9.7(L)  HCT 36.0 - 46.0 % 26.8(L) 26.5(L) 31.5(L)  PLT 150 - 400 K/uL 206 204 227  NEUTROABS 1.7 - 7.7 K/uL 4.0 - -  LYMPHSABS 0.7 - 4.0 K/uL 1.6 - -     There is no height or weight on file to calculate BMI.  Orders:  No orders of the defined types were placed in this encounter.  No orders of the defined types were placed in this encounter.    Procedures: No procedures performed  Clinical Data: No additional findings.  ROS:  All other systems negative, except as noted in the HPI. Review of Systems  Objective: Vital Signs: There were no vitals taken for this visit.  Specialty Comments:  No specialty comments available.  PMFS History: Patient Active Problem List   Diagnosis Date Noted  . Cellulitis of leg, right 08/28/2018  . Diarrhea 08/28/2018  . Opacity of lung on imaging study 08/28/2018  . Anemia of chronic disease 08/28/2018  . Chronic diastolic CHF (congestive heart failure) ([place]) 08/28/2018  . Essential hypertension 08/28/2018  . CAD (coronary artery disease) 08/28/2018  .  Diabetes mellitus type 2, diet-controlled (HCC) 08/28/2018  . CKD (chronic kidney disease) stage 3, GFR 30-59 ml/min (HCC) 08/28/2018   Past Medical History:  Diagnosis Date  . Anemia   . CHF (congestive heart failure) (HCC)   . Coronary artery disease   . Diabetes mellitus without complication (HCC)   . Lymphadenopathy   . Neuropathy   . Renal disorder   . Stroke [facility](HCC)     History reviewed. No pertinent family history.  Past Surgical History:  Procedure Laterality Date  . CHOLECYSTECTOMY    . TONSILLECTOMY    . TOTAL HIP ARTHROPLASTY     Social History   Occupational History  . Occupation: retired  Tobacco Use  . Smoking status: Never Smoker  . Smokeless tobacco: Never Used  Substance and  Sexual Activity  . Alcohol use: Not Currently    Frequency: Never    Comment: rare  . Drug use: Never  . Sexual activity: Not on file

## 2018-09-19 ENCOUNTER — Ambulatory Visit (INDEPENDENT_AMBULATORY_CARE_PROVIDER_SITE_OTHER): Payer: Medicare Other | Admitting: Orthopedic Surgery

## 2018-09-19 ENCOUNTER — Encounter: Payer: Self-pay | Admitting: Orthopedic Surgery

## 2018-09-19 VITALS — Ht 64.0 in | Wt 196.0 lb

## 2018-09-19 DIAGNOSIS — I890 Lymphedema, not elsewhere classified: Secondary | ICD-10-CM | POA: Diagnosis not present

## 2018-09-19 DIAGNOSIS — I872 Venous insufficiency (chronic) (peripheral): Secondary | ICD-10-CM

## 2018-09-19 NOTE — Progress Notes (Signed)
Office Visit Note   Patient: Gail Page           Date of Birth: 10/07/1930           MRN: [id number] Visit Date: 09/19/2018              Requested by: No referring provider defined for this encounter. PCP: System, Pcp Not In  Chief Complaint  Patient presents with  . Right Leg - Follow-up  . Left Leg - Follow-up      HPI: Patient is an 85 year old woman who was seen with her daughter who has venous and lymphatic insufficiency both lower extremities she has been wrapped and presents at this time for follow-up.  Patient states she scratched her knee and has a small skin tear on the right knee.  Assessment & Plan: Visit Diagnoses:  1. Venous stasis dermatitis of both lower extremities   2. Lymphedema of both lower extremities     Plan: Patient has had good improvement of the swelling we will place her in a medical compression stocking double extra-large stocking was applied she is given a prescription to get both a extra-large and large stocking eventually she will need a large stocking.  Recommend that she continue with her pneumatic compression with the socks on.  Follow-Up Instructions: Return if symptoms worsen or fail to improve.   Ortho Exam  Patient is alert, oriented, no adenopathy, well-dressed, normal affect, normal respiratory effort. Examination patient has brawny skin color changes in both legs right worse than left her skin now wrinkles there is no redness no cellulitis no signs of infection.  Her calf measures 40 cm in circumference on the right.  Imaging: No results found. No images are attached to the encounter.  Labs: Lab Results  Component Value Date   HGBA1C 5.1 08/31/2018   REPTSTATUS 09/05/2018 FINAL 09/01/2018   GRAMSTAIN  09/01/2018    WBC PRESENT, PREDOMINANTLY PMN NO ORGANISMS SEEN CYTOSPIN SMEAR    CULT  09/01/2018    NO GROWTH 3 DAYS Performed at [facility], [address] [place], [place] [address]      Lab Results   Component Value Date   ALBUMIN 2.6 (L) 08/28/2018    Lab Results  Component Value Date   MG 1.9 08/28/2018   No results found for: VD25OH  No results found for: PREALBUMIN CBC EXTENDED Latest Ref Rng & Units 08/31/2018 08/29/2018 08/28/2018  WBC 4.0 - 10.5 K/uL 6.9 7.9 8.0  RBC 3.87 - 5.11 MIL/uL 2.81(L) 2.77(L) 3.19(L)  HGB 12.0 - 15.0 g/dL 1.9(J8.5(L) 4.7(W8.4(L) 2.9(F9.7(L)  HCT 36.0 - 46.0 % 26.8(L) 26.5(L) 31.5(L)  PLT 150 - 400 K/uL 206 204 227  NEUTROABS 1.7 - 7.7 K/uL 4.0 - -  LYMPHSABS 0.7 - 4.0 K/uL 1.6 - -     Body mass index is 33.64 kg/m.  Orders:  No orders of the defined types were placed in this encounter.  No orders of the defined types were placed in this encounter.    Procedures: No procedures performed  Clinical Data: No additional findings.  ROS:  All other systems negative, except as noted in the HPI. Review of Systems  Objective: Vital Signs: Ht 5\' 4"  (1.626 m)   Wt 196 lb (88.9 kg)   BMI 33.64 kg/m   Specialty Comments:  No specialty comments available.  PMFS History: Patient Active Problem List   Diagnosis Date Noted  . Cellulitis of leg, right 08/28/2018  . Diarrhea 08/28/2018  .  Opacity of lung on imaging study 08/28/2018  . Anemia of chronic disease 08/28/2018  . Chronic diastolic CHF (congestive heart failure) ([place]) 08/28/2018  . Essential hypertension 08/28/2018  . CAD (coronary artery disease) 08/28/2018  . Diabetes mellitus type 2, diet-controlled ([place]) 08/28/2018  . CKD (chronic kidney disease) stage 3, GFR 30-59 ml/min ([place]) 08/28/2018   Past Medical History:  Diagnosis Date  . Anemia   . CHF (congestive heart failure) ([place])   . Coronary artery disease   . Diabetes mellitus without complication ([place])   . Lymphadenopathy   . Neuropathy   . Renal disorder   . Stroke [facility])     History reviewed. No pertinent family history.  Past Surgical History:  Procedure Laterality Date  . CHOLECYSTECTOMY    . TONSILLECTOMY    . TOTAL HIP  ARTHROPLASTY     Social History   Occupational History  . Occupation: retired  Tobacco Use  . Smoking status: Never Smoker  . Smokeless tobacco: Never Used  Substance and Sexual Activity  . Alcohol use: Not Currently    Frequency: Never    Comment: rare  . Drug use: Never  . Sexual activity: Not on file

## 2018-11-09 ENCOUNTER — Ambulatory Visit: Payer: Medicare Other | Attending: Otolaryngology

## 2018-11-09 ENCOUNTER — Other Ambulatory Visit: Payer: Self-pay

## 2018-11-09 DIAGNOSIS — M6281 Muscle weakness (generalized): Secondary | ICD-10-CM | POA: Insufficient documentation

## 2018-11-09 DIAGNOSIS — R2689 Other abnormalities of gait and mobility: Secondary | ICD-10-CM | POA: Diagnosis present

## 2018-11-09 DIAGNOSIS — R2681 Unsteadiness on feet: Secondary | ICD-10-CM | POA: Insufficient documentation

## 2018-11-09 NOTE — Patient Instructions (Signed)
Access Code: [email or web address]  URL: [email or web address]  Date: 11/09/2018  Prepared by: Roxana Hires   Exercises Seated Hip Abduction with Resistance - 10 reps - 2 sets - 3s hold - 2x daily - 7x weekly Seated March with Resistance - 10 reps - 2 sets - 3s hold - 2x daily - 7x weekly Narrow Stance with Counter Support - 3 reps - 30s hold - 2x daily - 7x weekly

## 2018-11-09 NOTE — Therapy (Signed)
[place] MAIN [facility] SERVICES [address] [place], [place], [address] Phone: [phone]   Fax:  [phone]  Physical Therapy Evaluation  Patient Details  Name: Gail Page MRN: [id number] Date of Birth: [birth year] Referring Provider (PT): Dr. Carloyn Manner    Encounter Date: 11/09/2018  PT End of Session - 11/09/18 1718    Visit Number  1    Number of Visits  25    Date for PT Re-Evaluation  02/01/19    Authorization Type  eval: 11/09/18    PT Start Time  0935    PT Stop Time  1035    PT Time Calculation (min)  60 min    Equipment Utilized During Treatment  Gait belt    Activity Tolerance  Patient tolerated treatment well    Behavior During Therapy  [facility] for tasks assessed/performed       Past Medical History:  Diagnosis Date  . Anemia   . CHF (congestive heart failure) ([place])   . Coronary artery disease   . Diabetes mellitus without complication ([place])   . Lymphadenopathy   . Neuropathy   . Renal disorder   . Stroke [facility])     Past Surgical History:  Procedure Laterality Date  . CHOLECYSTECTOMY    . TONSILLECTOMY    . TOTAL HIP ARTHROPLASTY      There were no vitals filed for this visit.   Subjective Assessment - 11/09/18 1407    Subjective  Pt presents today for an nsteadiness/dizziness evaluation    Patient is accompained by:  Family member   daughter   Pertinent History  Pt complains of unsteadiness/dizziness that initially began after a CVA in 1981, intensified after a MI in 2017, and that she feels she has become even more unsteady recently.  She decribes it is a lightheaded feeling, stating that she feels it all the time, but it fluctuates.  She states that her symptoms are worse when she hasn't eaten or after Lasix.  She reports that she has had BPPV before, but that she recently was evaluated by an ENT, stating that she did not have BPPV during her evaluation.  She denies any spinning sensations.  She reports 1  bad fall recently that lead to a hospitalization, and since then, she has completed some HH PT and moved in with her daughter due to safety concerns.    Limitations  Standing;Walking;House hold activities;Lifting    Currently in Pain?  No/denies        Objective measurements completed on examination: See above findings.   SUBJECTIVE Chief complaint: Pt complains of unsteadiness/dizziness that initially began after a CVA in 1981, intensified after a MI in 2017, and that she feels she has become even more unsteady recently.  She decribes it is a lightheaded feeling, stating that she feels it all the time, but it fluctuates.  She states that her symptoms are worse when she hasn't eaten or after Lasix.  She reports that she has had BPPV before, but that she recently was evaluated by an ENT, stating that she did not have BPPV during her evaluation.  She denies any spinning sensations.  She reports 1 bad fall recently that lead to a hospitalization, and since then, she has completed some HH PT and moved in with her daughter due to safety concerns.     Onset: originally in 1981 after a CVA, and intensified after a MI in 2017; hx of BPPV before, but negative recently Imaging: None  recently Recent changes in overall health/medication: No Directional pattern for falls: forwards Prior history of physical therapy for balance: [facility] Follow-up appointment with MD: December with the cardiologist Red flags (bowel/bladder changes, saddle paresthesia, personal history of cancer, chills/fever, night sweats, unrelenting pain) Negative  OBJECTIVE  MUSCULOSKELETAL: Tremor: Absent Bulk: Normal Tone: Normal, no clonus  Posture No gross abnormalities noted in standing or seated posture  Gait Decreased step and stride length, with decreased hip flexion and B trendelenburg gait.  Severe valgus moment noted in L knee.  Strength R/L 4/4 Hip flexion 3+/3+ Hip external rotation 4/4 Hip internal rotation 4/4  Hip extension  2+/2+ Hip abduction 3+/3+ Hip adduction 4+/4 Knee extension 5/5 Knee flexion 5/4+ Ankle Plantarflexion 4+/4+ Ankle Dorsiflexion   NEUROLOGICAL:  Mental Status Patient is oriented to person, place and time.  Recent memory is intact.  Remote memory is intact.  Attention span and concentration are intact.  Expressive speech is intact.  Patient's fund of knowledge is within normal limits for educational level.  Cranial Nerves No formally assessed today  Sensation Grossly intact to light touch bilateral UEs/LEs as determined by testing dermatomes C2-C4, and C8; T2/L2-S2 respectively, however C6 and C7 potentially diminshed on L. Proprioception and hot/cold testing deferred on this date  Reflexes Not formally assessed today  Coordination/Cerebellar Finger to Nose: WNL Heel to Shin: limited due to strength  Rapid alternating movements: WNL Finger Opposition: WNL Pronator Drift: Negative   FUNCTIONAL OUTCOME MEASURES   Results Comments  BERG 29/56 Fall risk, in need of intervention  TUG 14.22 seconds Fall risk  5TSTS 29.15 seconds  With intermittent use of the wall for steadying and CGA throughout due to unsteadiness (performed in a chair without armrests)  10 Meter Gait Speed Self-selected: 13.19s = .65ms; Fastest: 10.75s = .943m with rollator Below normative values for full community ambulation  DHI 70/100 Severe perception of having a handicap     OCULOMOTOR / VESTIBULAR TESTING: Deferred today   BPPV TESTS: Deferred today   [facility] - 11/09/18 0937      Assessment   Medical Diagnosis  dizziness/unsteadiness    Referring Provider (PT)  Dr. CrJeannie Fendaught    Onset Date/Surgical Date  01/27/79    Hand Dominance  Right    Next MD Visit  cardiologist in december    [facility]       Precautions   Precautions  Fall      Balance Screen   Has the patient fallen in the past 6 months  Yes    How many times?  1   1 bad  fall, but states that she stumbles all the time   Has the patient had a decrease in activity level because of a fear of falling?   Yes    Is the patient reluctant to leave their home because of a fear of falling?   Yes      [place]    Living Arrangements  Children   daughter and partner   Type of [place] enter    Entrance Stairs-Number of Steps  7    Entrance Stairs-Rails  Right;Left;Cannot reach both    [place] level    HoAlligator 2 wheels;Other (comment);Wheelchair - manual;Toilet riser;Bedside commode;Cane - single point   rollator     Prior Function   Level of  Independence  Independent    Vocation  Retired    Leisure  reading, internet, playing the piano, Herbalist   Overall Cognitive Status  Within Functional Limits for tasks assessed      Observation/Other Assessments   Other Surveys   Dizziness Handicap Inventory (DHI)    Dizziness Handicap Inventory (DHI)   70/100      Transfers   Five time sit to stand comments   29.15 seconds   With intermittent use of the wall for steadying and CGA      Standardized Balance Assessment   Standardized Balance Assessment  Berg Balance Test;Timed Up and Go Test;Five Times Sit to Stand;10 meter walk test    Five times sit to stand comments   29.15s with CGA throughout and  intermittent use of UE on wall    10 Meter Walk  Self-selected: 13.19s = .51ms; Fastest: 10.75s = .958m with rollator      Berg Balance Test   Sit to Stand  Able to stand  independently using hands    Standing Unsupported  Able to stand safely 2 minutes    Sitting with Back Unsupported but Feet Supported on Floor or Stool  Able to sit safely and securely 2 minutes    Stand to Sit  Controls descent by using hands    Transfers  Able to transfer safely, definite need of hands    Standing Unsupported with Eyes Closed  Able to stand 10 seconds with  supervision    Standing Unsupported with Feet Together  Able to place feet together independently and stand for 1 minute with supervision    From Standing, Reach Forward with Outstretched Arm  Can reach forward >5 cm safely (2")    From Standing Position, Pick up Object from Floor  Unable to pick up and needs supervision    From Standing Position, Turn to Look Behind Over each Shoulder  Needs supervision when turning    Turn 360 Degrees  Needs close supervision or verbal cueing    Standing Unsupported, Alternately Place Feet on Step/Stool  Needs assistance to keep from falling or unable to try    Standing Unsupported, One Foot in Front  Needs help to step but can hold 15 seconds    Standing on One Leg  Unable to try or needs assist to prevent fall    Total Score  29      Timed Up and Go Test   TUG  Normal TUG    Normal TUG (seconds)  14.22    TUG Comments  with rollator         Treatment HEP created and reviewed today with patient and daughter: Seated Hip Abduction with Resistance - 10 reps - 2 sets - 3s hold - 2x daily - 7x weekly Seated March with Resistance - 10 reps - 2 sets - 3s hold - 2x daily - 7x weekly Narrow Stance with Counter Support - 3 reps - 30s hold - 2x daily - 7x weekly         PT Short Term Goals - 11/09/18 1702      PT SHORT TERM GOAL #1   Title  Pt will be independent with HEP in order to improve strength and balance in order to decrease fall risk and improve function at home.    Time  6    Period  Weeks    Status  New    Target Date  12/21/18  PT Long Term Goals - 11/09/18 1703      PT LONG TERM GOAL #1   Title  Pt will improve BERG to >45 in order to decrease her fall risk    Baseline  11/09/18: 29/56    Time  12    Period  Weeks    Status  New    Target Date  02/01/19      PT LONG TERM GOAL #2   Title  Pt will decrease 5TSTS to <14.8s in order to met normative values for her demographic age range.    Baseline  11/09/18: 29.15s With  intermittent use of the wall for steadying and CGA throughout due to unsteadiness (performed in a chair without armrests)    Time  12    Period  Weeks    Status  New    Target Date  02/01/19      PT LONG TERM GOAL #3   Title  Pt will decrease TUG to below 14 seconds/decrease in order to demonstrate decreased fall risk.    Baseline  11/09/2018: 14.22s with a rollator    Time  12    Period  Weeks    Status  New    Target Date  02/01/19      PT LONG TERM GOAL #4   Title  Pt will decrease DHI score by at least 18 points in order to demonstrate clinically significant reduction in disability    Baseline  11/09/18: 70/100    Time  12    Period  Weeks    Status  New    Target Date  02/01/19            Plan - 11/09/18 1640    Clinical Impression Statement  Pt is a pleasant 85 year-old female referred for dizziness and unsteadiness.  She scored a 29/56 on the BERG today, indicating that she at high risk for future falls.  Her TUG was completed in 14.22 sec with the rollator and CGA throughout.  She completed the 5xSTS in 29.15s with CGA throughout for unsteadiness and the use of the wall intermittently for steadying.  Her self-selected speed for the 10MWT with the rollator is .79ms, while her fastest is .958m, both indicating that she is below normative values for community ambulation.  She endorses a 70/100 on the [facility], indicating that she has a severe perception of having a handicap. Will perform additional bedside vestibular testing as indicated at follow-up session.  Pt presents with deficits in strength, gait and balance. Pt will benefit from skilled PT services to address deficits in balance and decrease risk for future falls.    Personal Factors and Comorbidities  Age;Behavior Pattern;Comorbidity 3+;Past/Current Experience;Time since onset of injury/illness/exacerbation;Fitness    Comorbidities  hx of CVA, hx of MI, DM, CHF, Anemia, neuropathy, CAD, lymphedema     Examination-Activity Limitations  Lift;Squat;Caring for Others;Locomotion Level;Stairs;Carry;Stand    Examination-Participation Restrictions  Interpersonal Relationship;Cleaning;Community Activity;Driving;Shop    Stability/Clinical Decision Making  Evolving/Moderate complexity    Clinical Decision Making  Moderate    Rehab Potential  Poor    PT Frequency  2x / week    PT Duration  12 weeks    PT Treatment/Interventions  ADLs/Self Care Home Management;Canalith Repostioning;Cryotherapy;Electrical Stimulation;Moist Heat;Traction;Ultrasound;DME Instruction;Gait training;Stair training;Functional mobility training;Therapeutic activities;Therapeutic exercise;Balance training;Neuromuscular re-education;Patient/family education;Manual techniques;Dry needling;Taping;Vestibular;Spinal Manipulations;Joint Manipulations;Energy conservation;Aquatic Therapy;Iontophoresis 66m60ml Dexamethasone    PT Next Visit Plan  screen BPPV and oculomotor testing; progress strength and balance exercises    PT  Home Exercise Plan  [email or web address]    Consulted and Agree with Plan of Care  Patient;Family member/caregiver    Family Member Consulted  daughter       Patient will benefit from skilled therapeutic intervention in order to improve the following deficits and impairments:  Abnormal gait, Decreased balance, Decreased endurance, Decreased mobility, Difficulty walking, Dizziness, Decreased activity tolerance, Decreased coordination, Decreased strength  Visit Diagnosis: Unsteadiness on feet  Muscle weakness (generalized)  Other abnormalities of gait and mobility     Problem List Patient Active Problem List   Diagnosis Date Noted  . Cellulitis of leg, right 08/28/2018  . Diarrhea 08/28/2018  . Opacity of lung on imaging study 08/28/2018  . Anemia of chronic disease 08/28/2018  . Chronic diastolic CHF (congestive heart failure) ([place]) 08/28/2018  . Essential hypertension 08/28/2018  . CAD (coronary artery disease)  08/28/2018  . Diabetes mellitus type 2, diet-controlled ([place]) 08/28/2018  . CKD (chronic kidney disease) stage 3, GFR 30-59 ml/min 08/28/2018    This entire session was performed under direct supervision and direction of a licensed therapist/therapist assistant . I have personally read, edited and approve of the note as written.   Lutricia Horsfall, SPT [facility]out PT, DPT, GCS  Huprich,Jason 11/10/2018, 10:02 AM  [place] MAIN [facility] SERVICES [address] [place], [place], [address] Phone: [phone]   Fax:  [phone]  Name: Gail Page MRN: [id number] Date of Birth: [birth year]

## 2018-11-16 ENCOUNTER — Ambulatory Visit: Payer: Medicare Other

## 2018-11-16 ENCOUNTER — Other Ambulatory Visit: Payer: Self-pay

## 2018-11-16 DIAGNOSIS — R2681 Unsteadiness on feet: Secondary | ICD-10-CM

## 2018-11-16 DIAGNOSIS — R2689 Other abnormalities of gait and mobility: Secondary | ICD-10-CM

## 2018-11-16 DIAGNOSIS — M6281 Muscle weakness (generalized): Secondary | ICD-10-CM

## 2018-11-16 NOTE — Therapy (Signed)
[place] MAIN [facility] SERVICES [address] [place], [place], [address] Phone: [phone]   Fax:  [phone]  Physical Therapy Treatment  Patient Details  Name: Gail Page MRN: [id number] Date of Birth: 15-Dec-1930 Referring Provider (PT): Dr. Carloyn Manner   Encounter Date: 11/16/2018    Past Medical History:  Diagnosis Date  . Anemia   . CHF (congestive heart failure) ([place])   . Coronary artery disease   . Diabetes mellitus without complication ([place])   . Lymphadenopathy   . Neuropathy   . Renal disorder   . Stroke [facility])     Past Surgical History:  Procedure Laterality Date  . CHOLECYSTECTOMY    . TONSILLECTOMY    . TOTAL HIP ARTHROPLASTY      There were no vitals filed for this visit.  Subjective Assessment - 11/16/18 0948    Subjective  Pt reports 2-3/10 pain in her L knee today.  Pt endorses some lightheadedness this morning and today.  No falls since last visit. No new questions or concerns at this time.    Patient is accompained by:  Family member   daughter   Pertinent History  Pt complains of unsteadiness/dizziness that initially began after a CVA in 1981, intensified after a MI in 2017, and that she feels she has become even more unsteady recently.  She decribes it is a lightheaded feeling, stating that she feels it all the time, but it fluctuates.  She states that her symptoms are worse when she hasn't eaten or after Lasix.  She reports that she has had BPPV before, but that she recently was evaluated by an ENT, stating that she did not have BPPV during her evaluation.  She denies any spinning sensations.  She reports 1 bad fall recently that lead to a hospitalization, and since then, she has completed some HH PT and moved in with her daughter due to safety concerns.    Limitations  Standing;Walking;House hold activities;Lifting    Currently in Pain?  Yes    Pain Score  3     Pain Location  Knee    Pain Orientation   Left    Pain Descriptors / Indicators  Sore    Pain Type  Chronic pain    Pain Onset  More than a month ago    Pain Frequency  Intermittent       TREATMENT:  OCULOMOTOR / VESTIBULAR TESTING:  Oculomotor Exam- Room Light  Findings Comments  Ocular Alignment normal   Ocular ROM normal   Spontaneous Nystagmus normal   Gaze-Holding Nystagmus normal   End-Gaze Nystagmus normal   Vergence (normal 2-3") abnormal 10-12"  Smooth Pursuit normal   Cross-Cover Test not examined   Saccades normal   VOR Cancellation normal   Left Head Thrust not examined   Right Head Thrust not examined   Static Acuity not examined   Dynamic Acuity not examined     Oculomotor Exam- Fixation Suppressed  Findings Comments  Ocular Alignment normal   Spontaneous Nystagmus normal   Gaze-Holding Nystagmus normal   End-Gaze Nystagmus normal   Head Shaking Nystagmus normal   Pressure-Induced Nystagmus not examined   Hyperventilation Induced Nystagmus not examined   Skull Vibration Induced Nystagmus not examined     BPPV TESTS:  Symptoms Duration Intensity Nystagmus  L Dix-Hallpike yes 5-10sec 5/10  Upbeating, L torsional  R Dix-Hallpike None   None  L Head Roll      R Head Roll  L Sidelying Test      R Sidelying Test         Canalith Repositioning: Dix Hallpike + to L, - to R, with upbeating L torsional nystagmus noted when testing the L posterior canal.  She endorsed a 5/10 dizziness during the L [facility], with symptoms resolving within 5-10 seconds.  Retesting performed between maneuvers. 2 rounds of Epley maneuver performed to treat L posterior canal with 2 min holds during 1st maneuver and 1 min holds during 2nd maneuver. 3/10 dizziness endorsed with first Dix-Hallpike and 0.5/10 endorsed during second.  Upbeating L torsional nystagmus noted during first test and no nystagmus observed during second test.  Pt demonstrated good motivation throughout today's session.  Her oculomotor testing  was performed today, with a 10-12" convergence deficit, but otherwise normal findings with both room light and fixation suppressed oculomotor screening.  She does present with upbeating, L torsional nystagmus during L Dix Hallpike and L Epley maneuver that resolves quickly.  2 rounds of Epley maneuver were performed, with pt reporting resolution of symptoms at the end of the session.  She was provided with a handout about BPPV and educated on the condition, as well as informed that she might feel more unsteady and tired today after the canalith repositioning performed today.  Pt will continue to benefit from skilled PT services to address her BPPV as well as her deficits in balance to decrease risk for future falls.              PT Short Term Goals - 11/09/18 1702      PT SHORT TERM GOAL #1   Title  Pt will be independent with HEP in order to improve strength and balance in order to decrease fall risk and improve function at home.    Time  6    Period  Weeks    Status  New    Target Date  12/21/18        PT Long Term Goals - 11/09/18 1703      PT LONG TERM GOAL #1   Title  Pt will improve BERG to >45 in order to decrease her fall risk    Baseline  11/09/18: 29/56    Time  12    Period  Weeks    Status  New    Target Date  02/01/19      PT LONG TERM GOAL #2   Title  Pt will decrease 5TSTS to <14.8s in order to met normative values for her demographic age range.    Baseline  11/09/18: 29.15s With intermittent use of the wall for steadying and CGA throughout due to unsteadiness (performed in a chair without armrests)    Time  12    Period  Weeks    Status  New    Target Date  02/01/19      PT LONG TERM GOAL #3   Title  Pt will decrease TUG to below 14 seconds/decrease in order to demonstrate decreased fall risk.    Baseline  11/09/2018: 14.22s with a rollator    Time  12    Period  Weeks    Status  New    Target Date  02/01/19      PT LONG TERM GOAL #4   Title  Pt  will decrease DHI score by at least 18 points in order to demonstrate clinically significant reduction in disability    Baseline  11/09/18: 70/100    Time  12  Period  Weeks    Status  New    Target Date  02/01/19            Plan - 11/16/18 1105    Clinical Impression Statement  Pt demonstrated good motivation throughout today's session.  Her oculomotor testing was performed today, with a 10-12" convergence deficit, but otherwise normal findings with both room light and fixation suppressed oculomotor screening.  She does present with upbeating, L torsional nystagmus during L Dix Hallpike and L Epley maneuver that resolves quickly.  2 rounds of Epley maneuver were performed, with pt reporting resolution of symptoms at the end of the session.  She was provided with a handout about BPPV and educated on the condition, as well as informed that she might feel more unsteady and tired today after the canalith repositioning performed today.  Pt will continue to benefit from skilled PT services to address her BPPV as well as her deficits in balance to decrease risk for future falls.    Personal Factors and Comorbidities  Age;Behavior Pattern;Comorbidity 3+;Past/Current Experience;Time since onset of injury/illness/exacerbation;Fitness    Comorbidities  hx of CVA, hx of MI, DM, CHF, Anemia, neuropathy, CAD, lymphedema    Examination-Activity Limitations  Lift;Squat;Caring for Others;Locomotion Level;Stairs;Carry;Stand    Examination-Participation Restrictions  Interpersonal Relationship;Cleaning;Community Activity;Driving;Shop    Stability/Clinical Decision Making  Evolving/Moderate complexity    Rehab Potential  Poor    PT Frequency  2x / week    PT Duration  12 weeks    PT Treatment/Interventions  ADLs/Self Care Home Management;Canalith Repostioning;Cryotherapy;Electrical Stimulation;Moist Heat;Traction;Ultrasound;DME Instruction;Gait training;Stair training;Functional mobility training;Therapeutic  activities;Therapeutic exercise;Balance training;Neuromuscular re-education;Patient/family education;Manual techniques;Dry needling;Taping;Vestibular;Spinal Manipulations;Joint Manipulations;Energy conservation;Aquatic Therapy;Iontophoresis 2m/ml Dexamethasone    PT Next Visit Plan  recheck for L posterior canal BPPV; progress strength and balance    PT Home Exercise Plan  [id number]    Consulted and Agree with Plan of Care  Patient;Family member/caregiver    Family Member Consulted  daughter       Patient will benefit from skilled therapeutic intervention in order to improve the following deficits and impairments:  Abnormal gait, Decreased balance, Decreased endurance, Decreased mobility, Difficulty walking, Dizziness, Decreased activity tolerance, Decreased coordination, Decreased strength  Visit Diagnosis: Unsteadiness on feet  Muscle weakness (generalized)  Other abnormalities of gait and mobility     Problem List Patient Active Problem List   Diagnosis Date Noted  . Cellulitis of leg, right 08/28/2018  . Diarrhea 08/28/2018  . Opacity of lung on imaging study 08/28/2018  . Anemia of chronic disease 08/28/2018  . Chronic diastolic CHF (congestive heart failure) ([place] 08/28/2018  . Essential hypertension 08/28/2018  . CAD (coronary artery disease) 08/28/2018  . Diabetes mellitus type 2, diet-controlled ([place] 08/28/2018  . CKD (chronic kidney disease) stage 3, GFR 30-59 ml/min 08/28/2018    This entire session was performed under direct supervision and direction of a licensed therapist/therapist assistant . I have personally read, edited and approve of the note as written.   TLutricia Horsfall SPT JPhillips GroutPT, DPT, GCS  Huprich,Jason 11/16/2018, 5:07 PM  [place]MAIN RN[facility]ERVICES [address] [address]Phone: [phone]  Fax:  [phone] Name: Gail ProbyMRN: [id number] of Birth:  705-16-1932

## 2018-11-23 ENCOUNTER — Ambulatory Visit: Payer: Medicare Other

## 2018-11-23 ENCOUNTER — Other Ambulatory Visit: Payer: Self-pay

## 2018-11-23 VITALS — BP 154/60 | HR 55

## 2018-11-23 DIAGNOSIS — R2681 Unsteadiness on feet: Secondary | ICD-10-CM | POA: Diagnosis not present

## 2018-11-23 DIAGNOSIS — M6281 Muscle weakness (generalized): Secondary | ICD-10-CM

## 2018-11-23 NOTE — Therapy (Signed)
[place] MAIN [facility] SERVICES [address] [place], [place], [address] Phone: [phone]   Fax:  [phone]  Physical Therapy Treatment  Patient Details  Name: Gail Page MRN: [id number] Date of Birth: Oct 13, 1930 Referring Provider (PT): Dr. Carloyn Manner   Encounter Date: 11/23/2018  PT End of Session - 11/23/18 0946    Visit Number  3    Number of Visits  25    Date for PT Re-Evaluation  02/01/19    Authorization Type  eval: 11/09/18    PT Start Time  0945    PT Stop Time  1030    PT Time Calculation (min)  45 min    Equipment Utilized During Treatment  Gait belt    Activity Tolerance  Patient tolerated treatment well    Behavior During Therapy  [facility] for tasks assessed/performed       Past Medical History:  Diagnosis Date  . Anemia   . CHF (congestive heart failure) ([place])   . Coronary artery disease   . Diabetes mellitus without complication ([place])   . Lymphadenopathy   . Neuropathy   . Renal disorder   . Stroke [facility])     Past Surgical History:  Procedure Laterality Date  . CHOLECYSTECTOMY    . TONSILLECTOMY    . TOTAL HIP ARTHROPLASTY      Vitals:   11/23/18 0953  BP: (!) 154/60  Pulse: (!) 55  SpO2: 100%    Subjective Assessment - 11/23/18 0951    Subjective  Pt reports more generalized unsteadiness the past few days.  She doesn't report any spinning sensations today, but states that she feels more lightheaded.  She has taken Lasix recently, which in the past has lowered her BP.  No falls since last visit, but states that she feels like her knees have been buckling more. No new questions or concerns at this time.    Patient is accompained by:  Family member   daughter   Pertinent History  Pt complains of unsteadiness/dizziness that initially began after a CVA in 1981, intensified after a MI in 2017, and that she feels she has become even more unsteady recently.  She decribes it is a lightheaded feeling,  stating that she feels it all the time, but it fluctuates.  She states that her symptoms are worse when she hasn't eaten or after Lasix.  She reports that she has had BPPV before, but that she recently was evaluated by an ENT, stating that she did not have BPPV during her evaluation.  She denies any spinning sensations.  She reports 1 bad fall recently that lead to a hospitalization, and since then, she has completed some HH PT and moved in with her daughter due to safety concerns.    Limitations  Standing;Walking;House hold activities;Lifting    Pain Onset  --         Treatment:    Neuromuscular Reeducation: Marye Round negative bilaterally today with no nystagmus or reported dizziness today.  Tandem balance performed bilaterally 3x30s in // bars; CGA throughout with unsteadiness throughout.  airex balance beam forward and retro gait x5 lengths in each direction; intermittent SUE/BUE support with CGA throughout  airex balance beam side stepping x5 lengths in each direction; intermittent BUE support with CGA throughout   airex marching x15 bilaterally, requiring cueing to pause and reset her feet, demonstrating a tendency to step forward rather than only performing hip flexion while standing in place.  1/2 foam forward/backward rocking  x10 in each direction with BUE support in // bars  1/2 foam tandem balance 2x30s in each direction with intermittent SUE/BUE support in // bars  Step ups onto 6" box leading with RLE x10 with SUE support in // bars, but too painful to complete when leading with LLE.    Pt educated throughout session about proper posture and technique with exercises. Improved exercise technique, movement at target joints, use of target muscles after min to mod verbal, visual, tactile cues.     Pt demonstrated good motivation throughout today's session.  Dix-Hallpike was rechecked today, with negative tests bilaterally and no nystagmus or dizziness endorsed.  She  demonstrates difficulty with maintaining balance without UE support during exercises on foam today.  She also demonstrates significant knee pain when attempting a step up with her LLE however was able to complete LLE step ups with SUE support.   Pt will continue to benefit from skilled PT services to address her deficits in balance to decrease risk for future falls.           PT Short Term Goals - 11/09/18 1702      PT SHORT TERM GOAL #1   Title  Pt will be independent with HEP in order to improve strength and balance in order to decrease fall risk and improve function at home.    Time  6    Period  Weeks    Status  New    Target Date  12/21/18        PT Long Term Goals - 11/09/18 1703      PT LONG TERM GOAL #1   Title  Pt will improve BERG to >45 in order to decrease her fall risk    Baseline  11/09/18: 29/56    Time  12    Period  Weeks    Status  New    Target Date  02/01/19      PT LONG TERM GOAL #2   Title  Pt will decrease 5TSTS to <14.8s in order to met normative values for her demographic age range.    Baseline  11/09/18: 29.15s With intermittent use of the wall for steadying and CGA throughout due to unsteadiness (performed in a chair without armrests)    Time  12    Period  Weeks    Status  New    Target Date  02/01/19      PT LONG TERM GOAL #3   Title  Pt will decrease TUG to below 14 seconds/decrease in order to demonstrate decreased fall risk.    Baseline  11/09/2018: 14.22s with a rollator    Time  12    Period  Weeks    Status  New    Target Date  02/01/19      PT LONG TERM GOAL #4   Title  Pt will decrease DHI score by at least 18 points in order to demonstrate clinically significant reduction in disability    Baseline  11/09/18: 70/100    Time  12    Period  Weeks    Status  New    Target Date  02/01/19            Plan - 11/23/18 1240    Clinical Impression Statement  Pt demonstrated good motivation throughout today's session.   Dix-Hallpike was rechecked today, with negative tests bilaterally and no nystagmus or dizziness endorsed.  She demonstrates difficulty with maintaining balance without UE support during exercises on foam today.    She also demonstrates significant knee pain when attempting a step up with her LLE however was able to complete LLE step ups with SUE support.   Pt will continue to benefit from skilled PT services to address her deficits in balance to decrease risk for future falls.    Personal Factors and Comorbidities  Age;Behavior Pattern;Comorbidity 3+;Past/Current Experience;Time since onset of injury/illness/exacerbation;Fitness    Comorbidities  hx of CVA, hx of MI, DM, CHF, Anemia, neuropathy, CAD, lymphedema    Examination-Activity Limitations  Lift;Squat;Caring for Others;Locomotion Level;Stairs;Carry;Stand    Examination-Participation Restrictions  Interpersonal Relationship;Cleaning;Community Activity;Driving;Shop    Stability/Clinical Decision Making  Evolving/Moderate complexity    Rehab Potential  Poor    PT Frequency  2x / week    PT Duration  12 weeks    PT Treatment/Interventions  ADLs/Self Care Home Management;Canalith Repostioning;Cryotherapy;Electrical Stimulation;Moist Heat;Traction;Ultrasound;DME Instruction;Gait training;Stair training;Functional mobility training;Therapeutic activities;Therapeutic exercise;Balance training;Neuromuscular re-education;Patient/family education;Manual techniques;Dry needling;Taping;Vestibular;Spinal Manipulations;Joint Manipulations;Energy conservation;Aquatic Therapy;Iontophoresis 43m/ml Dexamethasone    PT Next Visit Plan  progress strength and balance    PT Home Exercise Plan  [id number]    Consulted and Agree with Plan of Care  Patient;Family member/caregiver    Family Member Consulted  daughter       Patient will benefit from skilled therapeutic intervention in order to improve the following deficits and impairments:  Abnormal gait, Decreased  balance, Decreased endurance, Decreased mobility, Difficulty walking, Dizziness, Decreased activity tolerance, Decreased coordination, Decreased strength  Visit Diagnosis: Unsteadiness on feet  Muscle weakness (generalized)     Problem List Patient Active Problem List   Diagnosis Date Noted  . Cellulitis of leg, right 08/28/2018  . Diarrhea 08/28/2018  . Opacity of lung on imaging study 08/28/2018  . Anemia of chronic disease 08/28/2018  . Chronic diastolic CHF (congestive heart failure) ([place] 08/28/2018  . Essential hypertension 08/28/2018  . CAD (coronary artery disease) 08/28/2018  . Diabetes mellitus type 2, diet-controlled ([place] 08/28/2018  . CKD (chronic kidney disease) stage 3, GFR 30-59 ml/min 08/28/2018    This entire session was performed under direct supervision and direction of a licensed therapist/therapist assistant . I have personally read, edited and approve of the note as written.   TLutricia Horsfall SPT JPhillips GroutPT, DPT, GCS  Huprich,Jason 11/24/2018, 9:24 AM  CChel[place] FallsMAIN RNew [facility]ERVICES [address] [address]Phone: [phone]  Fax:  [phone] Name: Gail PfefferkornMRN: [id number] of Birth: [birth year]

## 2018-11-30 ENCOUNTER — Other Ambulatory Visit: Payer: Self-pay

## 2018-11-30 ENCOUNTER — Ambulatory Visit: Payer: Medicare Other | Attending: Otolaryngology

## 2018-11-30 DIAGNOSIS — R2689 Other abnormalities of gait and mobility: Secondary | ICD-10-CM | POA: Diagnosis present

## 2018-11-30 DIAGNOSIS — R2681 Unsteadiness on feet: Secondary | ICD-10-CM | POA: Insufficient documentation

## 2018-11-30 DIAGNOSIS — M6281 Muscle weakness (generalized): Secondary | ICD-10-CM | POA: Diagnosis present

## 2018-11-30 NOTE — Therapy (Signed)
[place] MAIN [facility] SERVICES [address] [place], [place], [address] Phone: [phone]   Fax:  [phone]  Physical Therapy Treatment  Patient Details  Name: Gail Page MRN: [id number] Date of Birth: [birth year] Referring Provider (PT): Dr. Carloyn Manner   Encounter Date: 11/30/2018  PT End of Session - 11/30/18 0852    Visit Number  4    Number of Visits  25    Date for PT Re-Evaluation  02/01/19    Authorization Type  eval: 11/09/18    PT Start Time  0845    PT Stop Time  0930    PT Time Calculation (min)  45 min    Equipment Utilized During Treatment  Gait belt    Activity Tolerance  Patient tolerated treatment well    Behavior During Therapy  [facility] for tasks assessed/performed       Past Medical History:  Diagnosis Date  . Anemia   . CHF (congestive heart failure) ([place])   . Coronary artery disease   . Diabetes mellitus without complication ([place])   . Lymphadenopathy   . Neuropathy   . Renal disorder   . Stroke [facility])     Past Surgical History:  Procedure Laterality Date  . CHOLECYSTECTOMY    . TONSILLECTOMY    . TOTAL HIP ARTHROPLASTY      There were no vitals filed for this visit.  Subjective Assessment - 11/30/18 0851    Subjective  Pt reports some lightheadedness today, but no spinning sensations.  Her daughter states that they are going to get her rechecked soon for anemia.  She reports a 4/10 pain in her L knee.  No new questions or concerns at this time.    Patient is accompained by:  Family member   daughter   Pertinent History  Pt complains of unsteadiness/dizziness that initially began after a CVA in 1981, intensified after a MI in 2017, and that she feels she has become even more unsteady recently.  She decribes it is a lightheaded feeling, stating that she feels it all the time, but it fluctuates.  She states that her symptoms are worse when she hasn't eaten or after Lasix.  She reports that she has had  BPPV before, but that she recently was evaluated by an ENT, stating that she did not have BPPV during her evaluation.  She denies any spinning sensations.  She reports 1 bad fall recently that lead to a hospitalization, and since then, she has completed some HH PT and moved in with her daughter due to safety concerns.    Limitations  Standing;Walking;House hold activities;Lifting    Currently in Pain?  Yes    Pain Score  4     Pain Location  Knee    Pain Orientation  Left    Pain Descriptors / Indicators  Sore    Pain Type  Chronic pain    Pain Onset  More than a month ago    Pain Frequency  Intermittent        Treatment:    Neuromuscular Reeducation: NuSTEP 27mn total: 3.5 min at L2, but had to take a break due to SOB and fatigue.  Lowered to level 1 for the last 1.5 min.   Tandem balance on airex performed bilaterally 2x30s with each foot in front; with intermittent SUE support at side of treadmill and CGA throughout  Narrow stance balance on airex 3x30s with horizontal head turns with up to minA for steadying and  intermittent SUE support throughout  airex marching x15 bilaterally, requiring cueing to pause and reset her feet, demonstrating a tendency to step forward rather than only performing hip flexion while standing in place.  Standing hip abduction with red tband 2x10 it BUE support  Standing hip extension 2x10  SL Standing calf raises x10 on R; attempted on L but too difficult to perform  Bilateral calf raises 2x20  1/2 foam forward/backward rocking x10 in each direction with intermittent UE support at side of treadmill and CGA throughout  1/2 foam tandem balance 2x30s in each direction with intermittent SUE/BUE support at side of treadmill and CGA throughout   Pt educated throughout session about proper posture and technique with exercises. Improved exercise technique, movement at target joints, use of target muscles after min to mod verbal, visual, tactile  cues.     Pt demonstrated good motivation throughout today's session. She displays significant fatigue at L2 on the NuStep today during her warm-up, requiring a rest break after 3.5 min, demonstrated decreased CV endurance.  She demonstrates improvements in her balance today, requiring up to minA for steadying intermittently, but utilizing intermittent SUE rather than BUE support today.  She displays difficulty with SL calf raises on the LLE, unable to achieve full ROM. Pt willcontinue tobenefit from skilled PT services to address herdeficits in balance todecrease risk for future falls.                         PT Short Term Goals - 11/09/18 1702      PT SHORT TERM GOAL #1   Title  Pt will be independent with HEP in order to improve strength and balance in order to decrease fall risk and improve function at home.    Time  6    Period  Weeks    Status  New    Target Date  12/21/18        PT Long Term Goals - 11/09/18 1703      PT LONG TERM GOAL #1   Title  Pt will improve BERG to >45 in order to decrease her fall risk    Baseline  11/09/18: 29/56    Time  12    Period  Weeks    Status  New    Target Date  02/01/19      PT LONG TERM GOAL #2   Title  Pt will decrease 5TSTS to <14.8s in order to met normative values for her demographic age range.    Baseline  11/09/18: 29.15s With intermittent use of the wall for steadying and CGA throughout due to unsteadiness (performed in a chair without armrests)    Time  12    Period  Weeks    Status  New    Target Date  02/01/19      PT LONG TERM GOAL #3   Title  Pt will decrease TUG to below 14 seconds/decrease in order to demonstrate decreased fall risk.    Baseline  11/09/2018: 14.22s with a rollator    Time  12    Period  Weeks    Status  New    Target Date  02/01/19      PT LONG TERM GOAL #4   Title  Pt will decrease DHI score by at least 18 points in order to demonstrate clinically significant  reduction in disability    Baseline  11/09/18: 70/100    Time  12    Period  Weeks    Status  New    Target Date  02/01/19            Plan - 11/30/18 1211    Clinical Impression Statement  Pt demonstrated good motivation throughout today's session.  She displays significant fatigue at L2 on the NuStep today during her warm-up, requiring a rest break after 3.5 min, demonstrated decreased CV endurance.  She demonstrates improvements in her balance today, requiring up to minA for steadying intermittently, but utilizing intermittent SUE rather than BUE support today.  She displays difficulty with SL calf raises on the LLE, unable to achieve full ROM.  Pt will continue to benefit from skilled PT services to address her deficits in balance to decrease risk for future falls.    Personal Factors and Comorbidities  Age;Behavior Pattern;Comorbidity 3+;Past/Current Experience;Time since onset of injury/illness/exacerbation;Fitness    Comorbidities  hx of CVA, hx of MI, DM, CHF, Anemia, neuropathy, CAD, lymphedema    Examination-Activity Limitations  Lift;Squat;Caring for Others;Locomotion Level;Stairs;Carry;Stand    Examination-Participation Restrictions  Interpersonal Relationship;Cleaning;Community Activity;Driving;Shop    Stability/Clinical Decision Making  Evolving/Moderate complexity    Rehab Potential  Poor    PT Frequency  2x / week    PT Duration  12 weeks    PT Treatment/Interventions  ADLs/Self Care Home Management;Canalith Repostioning;Cryotherapy;Electrical Stimulation;Moist Heat;Traction;Ultrasound;DME Instruction;Gait training;Stair training;Functional mobility training;Therapeutic activities;Therapeutic exercise;Balance training;Neuromuscular re-education;Patient/family education;Manual techniques;Dry needling;Taping;Vestibular;Spinal Manipulations;Joint Manipulations;Energy conservation;Aquatic Therapy;Iontophoresis 27m/ml Dexamethasone    PT Next Visit Plan  progress strength and  balance    PT Home Exercise Plan  [id number]    Consulted and Agree with Plan of Care  Patient;Family member/caregiver    Family Member Consulted  daughter       Patient will benefit from skilled therapeutic intervention in order to improve the following deficits and impairments:  Abnormal gait, Decreased balance, Decreased endurance, Decreased mobility, Difficulty walking, Dizziness, Decreased activity tolerance, Decreased coordination, Decreased strength  Visit Diagnosis: Unsteadiness on feet  Muscle weakness (generalized)  Other abnormalities of gait and mobility     Problem List Patient Active Problem List   Diagnosis Date Noted  . Cellulitis of leg, right 08/28/2018  . Diarrhea 08/28/2018  . Opacity of lung on imaging study 08/28/2018  . Anemia of chronic disease 08/28/2018  . Chronic diastolic CHF (congestive heart failure) ([place] 08/28/2018  . Essential hypertension 08/28/2018  . CAD (coronary artery disease) 08/28/2018  . Diabetes mellitus type 2, diet-controlled ([place] 08/28/2018  . CKD (chronic kidney disease) stage 3, GFR 30-59 ml/min 08/28/2018     TLutricia Horsfall SPT JPhillips GroutPT, DPT, GCS  Huprich,Jason 11/30/2018, 5:11 PM  [facility] [address] [place] [address]Phone: [phone]  Fax:  [phone] Name: YWyllow SeiglerMRN: [id number] of Birth: [birth year]

## 2018-12-07 ENCOUNTER — Other Ambulatory Visit: Payer: Self-pay

## 2018-12-07 ENCOUNTER — Ambulatory Visit: Payer: Medicare Other

## 2018-12-07 DIAGNOSIS — R2681 Unsteadiness on feet: Secondary | ICD-10-CM | POA: Diagnosis not present

## 2018-12-07 DIAGNOSIS — M6281 Muscle weakness (generalized): Secondary | ICD-10-CM

## 2018-12-07 DIAGNOSIS — R2689 Other abnormalities of gait and mobility: Secondary | ICD-10-CM

## 2018-12-07 NOTE — Therapy (Signed)
[place] MAIN [facility] SERVICES [address] [place], [place], [address] Phone: [phone]   Fax:  [phone]  Physical Therapy Treatment  Patient Details  Name: Gail Page MRN: [id number] Date of Birth: 12/19/1930 Referring Provider (PT): Dr. Carloyn Manner   Encounter Date: 12/07/2018  PT End of Session - 12/07/18 1206    Visit Number  5    Number of Visits  25    Date for PT Re-Evaluation  02/01/19    Authorization Type  eval: 11/09/18    PT Start Time  0945    PT Stop Time  1030    PT Time Calculation (min)  45 min    Equipment Utilized During Treatment  Gait belt    Activity Tolerance  Patient tolerated treatment well    Behavior During Therapy  [facility] for tasks assessed/performed       Past Medical History:  Diagnosis Date  . Anemia   . CHF (congestive heart failure) ([place])   . Coronary artery disease   . Diabetes mellitus without complication ([place])   . Lymphadenopathy   . Neuropathy   . Renal disorder   . Stroke [facility])     Past Surgical History:  Procedure Laterality Date  . CHOLECYSTECTOMY    . TONSILLECTOMY    . TOTAL HIP ARTHROPLASTY      There were no vitals filed for this visit.  Subjective Assessment - 12/07/18 0950    Subjective  Pt reports that she has some unsteadiness today, but has not had anymore spinning sensations.  She has an appt with a new primary care provider on Friday, and her daughter states she is going to get new bloodwork to check for anemia at that time.  She reports a 4/10 pain in her L knee upon arrival, that goes up to a 9/10 on the NuSTEP.  No new questions or concerns at this time.    Patient is accompained by:  Family member   daughter   Pertinent History  Pt complains of unsteadiness/dizziness that initially began after a CVA in 1981, intensified after a MI in 2017, and that she feels she has become even more unsteady recently.  She decribes it is a lightheaded feeling, stating that she  feels it all the time, but it fluctuates.  She states that her symptoms are worse when she hasn't eaten or after Lasix.  She reports that she has had BPPV before, but that she recently was evaluated by an ENT, stating that she did not have BPPV during her evaluation.  She denies any spinning sensations.  She reports 1 bad fall recently that lead to a hospitalization, and since then, she has completed some HH PT and moved in with her daughter due to safety concerns.    Limitations  Standing;Walking;House hold activities;Lifting    Currently in Pain?  Yes    Pain Score  4     Pain Location  Knee    Pain Orientation  Left    Pain Descriptors / Indicators  Sore    Pain Type  Chronic pain    Pain Onset  More than a month ago    Pain Frequency  Intermittent         TREATMENT  Neuromuscular Reeducation: NuSTEP 38mn at L2, with SOB and fatigue after warm-up; SpO2 90%, HR 88 bpm with recovery to spO2 95% and HR to 80bpm  semitandem balance on airex performed bilaterally 3x30s with each foot in front; without UE  support at side of treadmill and CGA throughout  Narrow stance balance on airex 3x30s with horizontal head turns with up to minA for steadying and intermittent SUE support throughout  airex marching 2x15 bilaterally, requiring cueing to pause and reset her feet, demonstrating a tendency to step forward rather than only performing hip flexion while standing in place.  SUE support used throughout  Narrow stance on foam with horizontal head turns 2x45s with mild dizziness reported after the 1st set and no dizziness reported after the 2nd set   Therapeutic Exercise: Standing hip abduction with red tband 2x10 it BUE support  Standing hip extension 2x10  Seated knee extension with no wt on the LLE and red band on the RLE 2x10  Bilateral calf raises 2x25   STSs from standard chair with airex in seat without UE support 2x10     Pt educated throughout session about proper  posture and technique with exercises. Improved exercise technique, movement at target joints, use of target muscles after min to mod verbal, visual, tactile cues.   Pt demonstrated good motivation throughout today's session.She continues to display significant fatigue at L2 on the NuStep today, however she was able to complete 5 minutes without a rest break today.  She demonstrates the ability to perform semitandem balance on foam without UE support, displaying more unsteadiness with her LLE behind.  She responded well to additional strengthening exercises today, however is unable to complete knee extensions with reisstance on the LLE due to knee pain.  She is able to complete STSs without UE support today with an elevated seat. Pt willcontinue tobenefit from skilled PT services to address herdeficits in balance todecrease risk for future falls.                        PT Short Term Goals - 11/09/18 1702      PT SHORT TERM GOAL #1   Title  Pt will be independent with HEP in order to improve strength and balance in order to decrease fall risk and improve function at home.    Time  6    Period  Weeks    Status  New    Target Date  12/21/18        PT Long Term Goals - 11/09/18 1703      PT LONG TERM GOAL #1   Title  Pt will improve BERG to >45 in order to decrease her fall risk    Baseline  11/09/18: 29/56    Time  12    Period  Weeks    Status  New    Target Date  02/01/19      PT LONG TERM GOAL #2   Title  Pt will decrease 5TSTS to <14.8s in order to met normative values for her demographic age range.    Baseline  11/09/18: 29.15s With intermittent use of the wall for steadying and CGA throughout due to unsteadiness (performed in a chair without armrests)    Time  12    Period  Weeks    Status  New    Target Date  02/01/19      PT LONG TERM GOAL #3   Title  Pt will decrease TUG to below 14 seconds/decrease in order to demonstrate decreased fall risk.     Baseline  11/09/2018: 14.22s with a rollator    Time  12    Period  Weeks    Status  New  Target Date  02/01/19      PT LONG TERM GOAL #4   Title  Pt will decrease DHI score by at least 18 points in order to demonstrate clinically significant reduction in disability    Baseline  11/09/18: 70/100    Time  12    Period  Weeks    Status  New    Target Date  02/01/19            Plan - 12/07/18 1205    Clinical Impression Statement  Pt demonstrated good motivation throughout today's session.  She continues to display significant fatigue at L2 on the NuStep today, however she was able to complete 5 minutes without a rest break today.  She demonstrates the ability to perform semitandem balance on foam without UE support, displaying more unsteadiness with her LLE behind.  She responded well to additional strengthening exercises today, however is unable to complete knee extensions with reisstance on the LLE due to knee pain.  She is able to complete STSs without UE support today with an elevated seat.  Pt will continue to benefit from skilled PT services to address her deficits in balance to decrease risk for future falls.    Personal Factors and Comorbidities  Age;Behavior Pattern;Comorbidity 3+;Past/Current Experience;Time since onset of injury/illness/exacerbation;Fitness    Comorbidities  hx of CVA, hx of MI, DM, CHF, Anemia, neuropathy, CAD, lymphedema    Examination-Activity Limitations  Lift;Squat;Caring for Others;Locomotion Level;Stairs;Carry;Stand    Examination-Participation Restrictions  Interpersonal Relationship;Cleaning;Community Activity;Driving;Shop    Stability/Clinical Decision Making  Evolving/Moderate complexity    Rehab Potential  Poor    PT Frequency  2x / week    PT Duration  12 weeks    PT Treatment/Interventions  ADLs/Self Care Home Management;Canalith Repostioning;Cryotherapy;Electrical Stimulation;Moist Heat;Traction;Ultrasound;DME Instruction;Gait  training;Stair training;Functional mobility training;Therapeutic activities;Therapeutic exercise;Balance training;Neuromuscular re-education;Patient/family education;Manual techniques;Dry needling;Taping;Vestibular;Spinal Manipulations;Joint Manipulations;Energy conservation;Aquatic Therapy;Iontophoresis 50m/ml Dexamethasone    PT Next Visit Plan  progress strength and balance    PT Home Exercise Plan  [id number]    Consulted and Agree with Plan of Care  Patient;Family member/caregiver    Family Member Consulted  daughter       Patient will benefit from skilled therapeutic intervention in order to improve the following deficits and impairments:  Abnormal gait, Decreased balance, Decreased endurance, Decreased mobility, Difficulty walking, Dizziness, Decreased activity tolerance, Decreased coordination, Decreased strength  Visit Diagnosis: Unsteadiness on feet  Muscle weakness (generalized)  Other abnormalities of gait and mobility     Problem List Patient Active Problem List   Diagnosis Date Noted  . Cellulitis of leg, right 08/28/2018  . Diarrhea 08/28/2018  . Opacity of lung on imaging study 08/28/2018  . Anemia of chronic disease 08/28/2018  . Chronic diastolic CHF (congestive heart failure) ([place] 08/28/2018  . Essential hypertension 08/28/2018  . CAD (coronary artery disease) 08/28/2018  . Diabetes mellitus type 2, diet-controlled ([place] 08/28/2018  . CKD (chronic kidney disease) stage 3, GFR 30-59 ml/min 08/28/2018    This entire session was performed under direct supervision and direction of a licensed therapist/therapist assistant . I have personally read, edited and approve of the note as written.   TLutricia Horsfall SPT JPhillips GroutPT, DPT, GCS  Huprich,Jason 12/07/2018, 1:42 PM  CNorth BrowningMAIN [facility] SSERVICES [address] [place] [address]Phone: [phone]  Fax:  [phone] Name: YMelanye HiraldoMRN:  [id number] of Birth: 7Nov 14, 1932

## 2018-12-14 ENCOUNTER — Ambulatory Visit: Payer: Medicare Other

## 2018-12-14 ENCOUNTER — Other Ambulatory Visit: Payer: Self-pay

## 2018-12-14 DIAGNOSIS — M6281 Muscle weakness (generalized): Secondary | ICD-10-CM

## 2018-12-14 DIAGNOSIS — R2681 Unsteadiness on feet: Secondary | ICD-10-CM

## 2018-12-14 NOTE — Therapy (Signed)
[place] MAIN [facility] SERVICES [address] [place], [place], [address] Phone: [phone]   Fax:  [phone]  Physical Therapy Treatment  Patient Details  Name: Gail Page MRN: [id number] Date of Birth: 31-Dec-1930 Referring Provider (PT): Dr. Carloyn Manner   Encounter Date: 12/14/2018  PT End of Session - 12/14/18 1128    Visit Number  6    Number of Visits  25    Date for PT Re-Evaluation  02/01/19    Authorization Type  eval: 11/09/18    PT Start Time  0945    PT Stop Time  1030    PT Time Calculation (min)  45 min    Equipment Utilized During Treatment  Gait belt    Activity Tolerance  Patient tolerated treatment well    Behavior During Therapy  [facility] for tasks assessed/performed       Past Medical History:  Diagnosis Date  . Anemia   . CHF (congestive heart failure) ([place])   . Coronary artery disease   . Diabetes mellitus without complication ([place])   . Lymphadenopathy   . Neuropathy   . Renal disorder   . Stroke [facility])     Past Surgical History:  Procedure Laterality Date  . CHOLECYSTECTOMY    . TONSILLECTOMY    . TOTAL HIP ARTHROPLASTY      There were no vitals filed for this visit.  Subjective Assessment - 12/14/18 0955    Subjective  Pt reports that she feels like her unsteadiness is improving, and she states that she feels less out of breath walking into the building.  She reports that her bloodwork came back without any problems.  She reports a 4/10 pain in her L knee upon arrival, that goes up to a 9/10 on the NuSTEP.  No new questions or concerns at this time.    Patient is accompained by:  Family member   daughter   Pertinent History  Pt complains of unsteadiness/dizziness that initially began after a CVA in 1981, intensified after a MI in 2017, and that she feels she has become even more unsteady recently.  She decribes it is a lightheaded feeling, stating that she feels it all the time, but it fluctuates.   She states that her symptoms are worse when she hasn't eaten or after Lasix.  She reports that she has had BPPV before, but that she recently was evaluated by an ENT, stating that she did not have BPPV during her evaluation.  She denies any spinning sensations.  She reports 1 bad fall recently that lead to a hospitalization, and since then, she has completed some HH PT and moved in with her daughter due to safety concerns.    Limitations  Standing;Walking;House hold activities;Lifting    Pain Onset  --       TREATMENT  Neuromuscular Reeducation: NuSTEP 71mn at L3, with SOB and fatigue requiring 2 rest breaks throughout with SpO2 91% after 376m, HR 90 bpm with recovery to spO2 95% and HR to 80bpm  semitandem balanceon airexperformed bilaterally3x30swith each foot in front; without UE support at side of treadmill and CGA throughout  Narrow stance balance on airex 3x30s with horizontal head turns with up to minA for steadying and intermittent SUE support throughout  airex marching 2x10 bilaterally, requiring cueing to pause and reset her feet, demonstrating a tendency to step forward rather than only performing hip flexion while standing in place.  SUE with increasing BUE  support used throughout due  to L knee pain  1/2 foam tandem balance 2x30s with each foot in the lead  1/2 foam heel to toe rocking 2x10 in each direction   Therapeutic Exercise: Standing hip abduction with red tband 2x10 it BUE support  Standing hip extension 2x10  Bilateral calf raises 2x25      Pt educated throughout session about proper posture and technique with exercises. Improved exercise technique, movement at target joints, use of target muscles after min to mod verbal, visual, tactile cues.   Pt continues to demonstrate good motivation throughout today's session.She continues to display significant fatigue on the  NuStep today, requiring 2 rest break today in her 5 minutes.  She  demonstrates more difficulty with L SLS activities or balance exercises requiring her LLE more posterior than her RLE.  She demonstrates the ability to perform balance exercises with less UE support today.Pt willcontinue tobenefit from skilled PT services to address herdeficits in balance todecrease risk for future falls.                          PT Short Term Goals - 11/09/18 1702      PT SHORT TERM GOAL #1   Title  Pt will be independent with HEP in order to improve strength and balance in order to decrease fall risk and improve function at home.    Time  6    Period  Weeks    Status  New    Target Date  12/21/18        PT Long Term Goals - 11/09/18 1703      PT LONG TERM GOAL #1   Title  Pt will improve BERG to >45 in order to decrease her fall risk    Baseline  11/09/18: 29/56    Time  12    Period  Weeks    Status  New    Target Date  02/01/19      PT LONG TERM GOAL #2   Title  Pt will decrease 5TSTS to <14.8s in order to met normative values for her demographic age range.    Baseline  11/09/18: 29.15s With intermittent use of the wall for steadying and CGA throughout due to unsteadiness (performed in a chair without armrests)    Time  12    Period  Weeks    Status  New    Target Date  02/01/19      PT LONG TERM GOAL #3   Title  Pt will decrease TUG to below 14 seconds/decrease in order to demonstrate decreased fall risk.    Baseline  11/09/2018: 14.22s with a rollator    Time  12    Period  Weeks    Status  New    Target Date  02/01/19      PT LONG TERM GOAL #4   Title  Pt will decrease DHI score by at least 18 points in order to demonstrate clinically significant reduction in disability    Baseline  11/09/18: 70/100    Time  12    Period  Weeks    Status  New    Target Date  02/01/19            Plan - 12/14/18 1130    Clinical Impression Statement  Pt continues to demonstrate good motivation throughout today's session.  She  continues to display significant fatigue on the  NuStep today, requiring 2 rest break today in her 5 minutes.  She demonstrates more difficulty with L SLS activities or balance exercises requiring her LLE more posterior than her RLE.  She demonstrates the ability to perform balance exercises with less UE support today. Pt will continue to benefit from skilled PT services to address her deficits in balance to decrease risk for future falls.    Personal Factors and Comorbidities  Age;Behavior Pattern;Comorbidity 3+;Past/Current Experience;Time since onset of injury/illness/exacerbation;Fitness    Comorbidities  hx of CVA, hx of MI, DM, CHF, Anemia, neuropathy, CAD, lymphedema    Examination-Activity Limitations  Lift;Squat;Caring for Others;Locomotion Level;Stairs;Carry;Stand    Examination-Participation Restrictions  Interpersonal Relationship;Cleaning;Community Activity;Driving;Shop    Stability/Clinical Decision Making  Evolving/Moderate complexity    Rehab Potential  Poor    PT Frequency  2x / week    PT Duration  12 weeks    PT Treatment/Interventions  ADLs/Self Care Home Management;Canalith Repostioning;Cryotherapy;Electrical Stimulation;Moist Heat;Traction;Ultrasound;DME Instruction;Gait training;Stair training;Functional mobility training;Therapeutic activities;Therapeutic exercise;Balance training;Neuromuscular re-education;Patient/family education;Manual techniques;Dry needling;Taping;Vestibular;Spinal Manipulations;Joint Manipulations;Energy conservation;Aquatic Therapy;Iontophoresis 92m/ml Dexamethasone    PT Next Visit Plan  progress strength and balance    PT Home Exercise Plan  [id number]    Consulted and Agree with Plan of Care  Patient;Family member/caregiver    Family Member Consulted  daughter       Patient will benefit from skilled therapeutic intervention in order to improve the following deficits and impairments:  Abnormal gait, Decreased balance, Decreased endurance, Decreased  mobility, Difficulty walking, Dizziness, Decreased activity tolerance, Decreased coordination, Decreased strength  Visit Diagnosis: Unsteadiness on feet  Muscle weakness (generalized)     Problem List Patient Active Problem List   Diagnosis Date Noted  . Cellulitis of leg, right 08/28/2018  . Diarrhea 08/28/2018  . Opacity of lung on imaging study 08/28/2018  . Anemia of chronic disease 08/28/2018  . Chronic diastolic CHF ([place]estive heart failure) ([place] 08/28/2018  . Essential hypertension 08/28/2018  . CAD (coronary artery disease) 08/28/2018  . Diabetes mellitus type 2, diet-controlled ([place] 08/28/2018  . CKD (chronic kidney disease) stage 3, GFR 30-59 ml/min 08/28/2018    This entire session was performed under direct supervision and direction of a licensed therapist/therapist assistant . I have personally read, edited and approve of the note as written.   TLutricia Horsfall SPT JPhillips GroutPT, DPT, GCS  Huprich,Jason 12/15/2018, 1:10 PM  [place] [facility] [address] [place] [address]Phone: [phone]  Fax:  [phone] Name: Gail RedingerMRN: [id number] of Birth: 718-Sep-1932

## 2018-12-21 ENCOUNTER — Ambulatory Visit: Payer: Medicare Other

## 2018-12-21 ENCOUNTER — Other Ambulatory Visit: Payer: Self-pay

## 2018-12-21 DIAGNOSIS — M6281 Muscle weakness (generalized): Secondary | ICD-10-CM

## 2018-12-21 DIAGNOSIS — R2681 Unsteadiness on feet: Secondary | ICD-10-CM | POA: Diagnosis not present

## 2018-12-21 DIAGNOSIS — R2689 Other abnormalities of gait and mobility: Secondary | ICD-10-CM

## 2018-12-21 NOTE — Therapy (Signed)
[place] MAIN [facility] SERVICES [address] [place], [place], [address] Phone: [phone]   Fax:  [phone]  Physical Therapy Treatment  Patient Details  Name: Gail Page MRN: [id number] Date of Birth: 1930-08-04 Referring Provider (PT): Dr. Carloyn Manner   Encounter Date: 12/21/2018  PT End of Session - 12/21/18 0941    Visit Number  7    Number of Visits  25    Date for PT Re-Evaluation  02/01/19    Authorization Type  eval: 11/09/18    PT Start Time  0945    PT Stop Time  1030    PT Time Calculation (min)  45 min    Equipment Utilized During Treatment  Gait belt    Activity Tolerance  Patient tolerated treatment well    Behavior During Therapy  [facility] for tasks assessed/performed       Past Medical History:  Diagnosis Date  . Anemia   . CHF (congestive heart failure) ([place])   . Coronary artery disease   . Diabetes mellitus without complication ([place])   . Lymphadenopathy   . Neuropathy   . Renal disorder   . Stroke [facility])     Past Surgical History:  Procedure Laterality Date  . CHOLECYSTECTOMY    . TONSILLECTOMY    . TOTAL HIP ARTHROPLASTY      There were no vitals filed for this visit.  Subjective Assessment - 12/21/18 0941    Subjective  Pt reports that she is doing well today. She is complaining of some pain in her L knee upon arrival and reports it as a 3/10 prior to activity. No specific questions at this time.    Patient is accompained by:  Family member   daughter   Pertinent History  Pt complains of unsteadiness/dizziness that initially began after a CVA in 1981, intensified after a MI in 2017, and that she feels she has become even more unsteady recently.  She decribes it is a lightheaded feeling, stating that she feels it all the time, but it fluctuates.  She states that her symptoms are worse when she hasn't eaten or after Lasix.  She reports that she has had BPPV before, but that she recently was evaluated by  an ENT, stating that she did not have BPPV during her evaluation.  She denies any spinning sensations.  She reports 1 bad fall recently that lead to a hospitalization, and since then, she has completed some HH PT and moved in with her daughter due to safety concerns.    Limitations  Standing;Walking;House hold activities;Lifting    Currently in Pain?  Yes    Pain Score  3     Pain Location  Knee    Pain Orientation  Left    Pain Descriptors / Indicators  Sore    Pain Type  Chronic pain    Pain Onset  More than a month ago    Pain Frequency  Intermittent           TREATMENT   Neuromuscular Reeducation: Airex 6" step taps alternating LE x 10 on each side with faded UE support, attempted no support however pt struggles to perform without maintaining at least 2 finger contact; Airex WBOS ball passes around body with head/eye follow and therapist varying height of ball from waist to overhead x 10 each direction; Hurdle steps (without UE support) over 1/2 foam roll x 10 on each side; Tandem gait in // bars without UE support x 4  lengths;    Therapeutic Exercise: NuStep x 5mn at L2-3,with SOB and fatigue, no rest breaks required today however HR and SpO2 monitored continuously thorughout with HR drop running around 100 bpm and SpO2 around 90% but occasionally dropping into the mid 80's with therapist adjusting resistance and intensity provided by patient to maintain SpO2 in safe ranges. Pt denies any increase in knee pain at end of session; Quantum leg press, attempted bilateral but too much pain reported in L knee, adjusted to R leg single leg press 60# x 20, 75# x 20 (pt still not fatigued at end of second set so will increase resistance during next session); Sit to stand without UE support from regular height chair x 5;   Pt educated throughout session about proper posture and technique with exercises. Improved exercise technique, movement at target joints, use of target muscles  after min to mod verbal, visual, tactile cues.   Pt continues to demonstrate good motivation throughout today's session.Shedisplays less fatigue on the NuStep today but requires continual monitoring of vitals with therapist adjusting resistance and intensity based on SpO2 levels. Attempted bilateral leg press however pt is unable to perform due to L knee pain so perform R single leg press only. She continues to demonstrate difficulty with balance in single leg stance especially on the L side as well as on unstable surfaces.Pt willcontinue tobenefit from skilled PT services to address herdeficits in balance todecrease risk for future falls.                      PT Short Term Goals - 11/09/18 1702      PT SHORT TERM GOAL #1   Title  Pt will be independent with HEP in order to improve strength and balance in order to decrease fall risk and improve function at home.    Time  6    Period  Weeks    Status  New    Target Date  12/21/18        PT Long Term Goals - 11/09/18 1703      PT LONG TERM GOAL #1   Title  Pt will improve BERG to >45 in order to decrease her fall risk    Baseline  11/09/18: 29/56    Time  12    Period  Weeks    Status  New    Target Date  02/01/19      PT LONG TERM GOAL #2   Title  Pt will decrease 5TSTS to <14.8s in order to met normative values for her demographic age range.    Baseline  11/09/18: 29.15s With intermittent use of the wall for steadying and CGA throughout due to unsteadiness (performed in a chair without armrests)    Time  12    Period  Weeks    Status  New    Target Date  02/01/19      PT LONG TERM GOAL #3   Title  Pt will decrease TUG to below 14 seconds/decrease in order to demonstrate decreased fall risk.    Baseline  11/09/2018: 14.22s with a rollator    Time  12    Period  Weeks    Status  New    Target Date  02/01/19      PT LONG TERM GOAL #4   Title  Pt will decrease DHI score by at least 18 points in  order to demonstrate clinically significant reduction in disability    Baseline  11/09/18: 70/100    Time  12    Period  Weeks    Status  New    Target Date  02/01/19            Plan - 12/21/18 0942    Clinical Impression Statement  Pt continues to demonstrate good motivation throughout today's session.  She displays less fatigue on the NuStep today but requires continual monitoring of vitals with therapist adjusting resistance and intensity based on SpO2 levels. Attempted bilateral leg press however pt is unable to perform due to L knee pain so perform R single leg press only. She continues to demonstrate difficulty with balance in single leg stance especially on the L side as well as on unstable surfaces. Pt will continue to benefit from skilled PT services to address her deficits in balance to decrease risk for future falls.    Personal Factors and Comorbidities  Age;Behavior Pattern;Comorbidity 3+;Past/Current Experience;Time since onset of injury/illness/exacerbation;Fitness    Comorbidities  hx of CVA, hx of MI, DM, CHF, Anemia, neuropathy, CAD, lymphedema    Examination-Activity Limitations  Lift;Squat;Caring for Others;Locomotion Level;Stairs;Carry;Stand    Examination-Participation Restrictions  Interpersonal Relationship;Cleaning;Community Activity;Driving;Shop    Stability/Clinical Decision Making  Evolving/Moderate complexity    Rehab Potential  Poor    PT Frequency  2x / week    PT Duration  12 weeks    PT Treatment/Interventions  ADLs/Self Care Home Management;Canalith Repostioning;Cryotherapy;Electrical Stimulation;Moist Heat;Traction;Ultrasound;DME Instruction;Gait training;Stair training;Functional mobility training;Therapeutic activities;Therapeutic exercise;Balance training;Neuromuscular re-education;Patient/family education;Manual techniques;Dry needling;Taping;Vestibular;Spinal Manipulations;Joint Manipulations;Energy conservation;Aquatic Therapy;Iontophoresis 44m/ml  Dexamethasone    PT Next Visit Plan  progress strength and balance    PT Home Exercise Plan  [id number]    Consulted and Agree with Plan of Care  Patient;Family member/caregiver    Family Member Consulted  daughter       Patient will benefit from skilled therapeutic intervention in order to improve the following deficits and impairments:  Abnormal gait, Decreased balance, Decreased endurance, Decreased mobility, Difficulty walking, Dizziness, Decreased activity tolerance, Decreased coordination, Decreased strength  Visit Diagnosis: Unsteadiness on feet  Muscle weakness (generalized)  Other abnormalities of gait and mobility     Problem List Patient Active Problem List   Diagnosis Date Noted  . Cellulitis of leg, right 08/28/2018  . Diarrhea 08/28/2018  . Opacity of lung on imaging study 08/28/2018  . Anemia of chronic disease 08/28/2018  . Chronic diastolic CH[place] (congestive heart failure) ([place] 08/28/2018  . Essential hypertension 08/28/2018  . CAD (coronary artery disease) 08/28/2018  . Diabetes mellitus type 2, diet-controlled ([place] 08/28/2018  . CKD (chronic kidney disease) stage 3, GFR 30-59 ml/min 08/28/2018   JPhillips GroutPT, DPT, GCS  Huprich,Jason 12/21/2018, 11:49 AM  [place]MAIN RMission [facility] CenterSERVICES [address] [place] [address]Phone: [phone]  Fax:  [phone] Name: Gail PopocaMRN: [id number] of Birth: 7October 19, 1932

## 2018-12-28 ENCOUNTER — Ambulatory Visit: Payer: Medicare Other | Attending: Otolaryngology

## 2018-12-28 ENCOUNTER — Other Ambulatory Visit: Payer: Self-pay

## 2018-12-28 DIAGNOSIS — M6281 Muscle weakness (generalized): Secondary | ICD-10-CM | POA: Insufficient documentation

## 2018-12-28 DIAGNOSIS — R2689 Other abnormalities of gait and mobility: Secondary | ICD-10-CM | POA: Insufficient documentation

## 2018-12-28 DIAGNOSIS — R2681 Unsteadiness on feet: Secondary | ICD-10-CM

## 2018-12-28 NOTE — Therapy (Signed)
[place] MAIN [facility] SERVICES [address] [place], [place], [address] Phone: [phone]   Fax:  [phone]  Physical Therapy Treatment  Patient Details  Name: Saralynn Langhorst MRN: [id number] Date of Birth: [birth year] Referring Provider (PT): Dr. Carloyn Manner   Encounter Date: 12/28/2018  PT End of Session - 12/28/18 0958    Visit Number  8    Number of Visits  25    Date for PT Re-Evaluation  02/01/19    Authorization Type  eval: 11/09/18    PT Start Time  0946    PT Stop Time  1026    PT Time Calculation (min)  40 min    Equipment Utilized During Treatment  Gait belt    Activity Tolerance  Patient tolerated treatment well;No increased pain;Patient limited by fatigue    Behavior During Therapy  [facility] for tasks assessed/performed       Past Medical History:  Diagnosis Date  . Anemia   . CHF (congestive heart failure) ([place])   . Coronary artery disease   . Diabetes mellitus without complication ([place])   . Lymphadenopathy   . Neuropathy   . Renal disorder   . Stroke [facility])     Past Surgical History:  Procedure Laterality Date  . CHOLECYSTECTOMY    . TONSILLECTOMY    . TOTAL HIP ARTHROPLASTY      There were no vitals filed for this visit.  Subjective Assessment - 12/28/18 0956    Subjective  Pt doing well in general, reports continued Left knee pain similar to ongoing issue fo r20+ years. Shehas a tele visit with cardiology later today for routine check up.    Pertinent History  Pt complains of unsteadiness/dizziness that initially began after a CVA in 1981, intensified after a MI in 2017, and that she feels she has become even more unsteady recently.  She decribes it is a lightheaded feeling, stating that she feels it all the time, but it fluctuates.  She states that her symptoms are worse when she hasn't eaten or after Lasix.  She reports that she has had BPPV before, but that she recently was evaluated by an ENT, stating that  she did not have BPPV during her evaluation.  She denies any spinning sensations.  She reports 1 bad fall recently that lead to a hospitalization, and since then, she has completed some HH PT and moved in with her daughter due to safety concerns.    Currently in Pain?  Yes    Pain Score  3     Pain Location  --   Left knee     INTERVENTION THIS DATE   Therapeutic Exercise: -AMB in hallway, self selected speed with rollator, 421f in 3 minutes 45 seconds.  *Terminal SpO2 91%, HR 68bpm -Sit to stand without UE support from regular height chair x 5 (intensity too high); -STS from chair + airex 1x10, pt elects to limit Left knee flexion for pain control -seated LLE LAQ 1x15 unloaded (reported less painful than whne walking) -seated LLE LAQ 1x15 c 2lb ("easy", no pain); LAQ 1x15 c 4lb (some mild back pain, minimal knee pain) -standing LLE extension+abdct 1x15 c 2lb AW    Neuromuscular Reeducation: -Firm surface, normal stance, marching in place 2x20 reciprocal pattern, minguard to minA  -lateral stepping without UE support 1x278fbilat, minGuardAssist to minA  -retroAMB unsupported, firm surface @ minA, verbal cues to challenge self with step length  -Airex WBOS ball passes around body  with head/eye follow and therapist varying height of ball from waist to overhead x 10 each direction; -Airex foam verticla head turns x15 -airex foam horizontal head turns x 15 bilat  -FWD step ups floor to foam 1x10, LUE support on chairback, pt self selects to lead with RLE       PT Short Term Goals - 11/09/18 1702      PT SHORT TERM GOAL #1   Title  Pt will be independent with HEP in order to improve strength and balance in order to decrease fall risk and improve function at home.    Time  6    Period  Weeks    Status  New    Target Date  12/21/18        PT Long Term Goals - 11/09/18 1703      PT LONG TERM GOAL #1   Title  Pt will improve BERG to >45 in order to decrease her fall risk     Baseline  11/09/18: 29/56    Time  12    Period  Weeks    Status  New    Target Date  02/01/19      PT LONG TERM GOAL #2   Title  Pt will decrease 5TSTS to <14.8s in order to met normative values for her demographic age range.    Baseline  11/09/18: 29.15s With intermittent use of the wall for steadying and CGA throughout due to unsteadiness (performed in a chair without armrests)    Time  12    Period  Weeks    Status  New    Target Date  02/01/19      PT LONG TERM GOAL #3   Title  Pt will decrease TUG to below 14 seconds/decrease in order to demonstrate decreased fall risk.    Baseline  11/09/2018: 14.22s with a rollator    Time  12    Period  Weeks    Status  New    Target Date  02/01/19      PT LONG TERM GOAL #4   Title  Pt will decrease DHI score by at least 18 points in order to demonstrate clinically significant reduction in disability    Baseline  11/09/18: 70/100    Time  12    Period  Weeks    Status  New    Target Date  02/01/19            Plan - 12/28/18 0959    Clinical Impression Statement  Strengthening to address chronic orthopedic impairments that limit balance and righting responses. Extensive step based balance training without UE support. Pt tolerates well but does require rest breaks intermittently. On foam, function sway area is quite limited however proprioception appears to be good, and righting reponses are quick. Pt doe srequire occasional assistance correct LOB. Pt reports her left knee feel better at end of session with less pain.    Rehab Potential  Poor    PT Frequency  2x / week    PT Duration  12 weeks    PT Treatment/Interventions  ADLs/Self Care Home Management;Canalith Repostioning;Cryotherapy;Electrical Stimulation;Moist Heat;Traction;Ultrasound;DME Instruction;Gait training;Stair training;Functional mobility training;Therapeutic activities;Therapeutic exercise;Balance training;Neuromuscular re-education;Patient/family education;Manual  techniques;Dry needling;Taping;Vestibular;Spinal Manipulations;Joint Manipulations;Energy conservation;Aquatic Therapy;Iontophoresis 30m/ml Dexamethasone    PT Next Visit Plan  progress strength and balance    PT Home Exercise Plan  [id number]    Consulted and Agree with Plan of Care  Patient       Patient will  benefit from skilled therapeutic intervention in order to improve the following deficits and impairments:  Abnormal gait, Decreased balance, Decreased endurance, Decreased mobility, Difficulty walking, Dizziness, Decreased activity tolerance, Decreased coordination, Decreased strength  Visit Diagnosis: Unsteadiness on feet  Muscle weakness (generalized)  Other abnormalities of gait and mobility     Problem List Patient Active Problem List   Diagnosis Date Noted  . Cellulitis of leg, right 08/28/2018  . Diarrhea 08/28/2018  . Opacity of lung on imaging study 08/28/2018  . Anemia of chronic disease 08/28/2018  . Chronic diastolic CHF (congestive heart failure) ([employer]) 08/28/2018  . Essential hypertension 08/28/2018  . CAD (coronary artery disease) 08/28/2018  . Diabetes mellitus type 2, diet-controlled ([place]) 08/28/2018  . CKD (chronic kidney disease) stage 3, GFR 30-59 ml/min 08/28/2018   10:29 AM, 12/28/18 Etta Grandchild, PT, DPT Physical Therapist - [facility]  Outpatient Physical Therapy- [place] [phone]     Etta Grandchild 12/28/2018, 10:03 AM  [place] MAIN [facility] SERVICES [address] [place], [place], [address] Phone: [phone]   Fax:  [phone]  Name: Jazsmin Couse MRN: [id number] Date of Birth: October 12, 1930

## 2019-01-02 ENCOUNTER — Other Ambulatory Visit: Payer: Self-pay

## 2019-01-02 ENCOUNTER — Ambulatory Visit: Payer: Medicare Other

## 2019-01-02 DIAGNOSIS — R2689 Other abnormalities of gait and mobility: Secondary | ICD-10-CM

## 2019-01-02 DIAGNOSIS — M6281 Muscle weakness (generalized): Secondary | ICD-10-CM

## 2019-01-02 DIAGNOSIS — R2681 Unsteadiness on feet: Secondary | ICD-10-CM | POA: Diagnosis not present

## 2019-01-02 NOTE — Therapy (Signed)
[place] MAIN [facility] SERVICES [address] [place], [place], [address] Phone: [phone]   Fax:  [phone]  Physical Therapy Treatment  Patient Details  Name: Gail Page MRN: [id number] Date of Birth: Jun 08, 1930 Referring Provider (PT): Dr. Carloyn Manner   Encounter Date: 01/02/2019  PT End of Session - 01/02/19 1440    Visit Number  9    Number of Visits  25    Date for PT Re-Evaluation  02/01/19    Authorization Type  eval: 11/09/18    PT Start Time  1432    PT Stop Time  1515    PT Time Calculation (min)  43 min    Equipment Utilized During Treatment  Gait belt    Activity Tolerance  Patient tolerated treatment well;Patient limited by fatigue    Behavior During Therapy  [facility] for tasks assessed/performed       Past Medical History:  Diagnosis Date  . Anemia   . CHF (congestive heart failure) ([place])   . Coronary artery disease   . Diabetes mellitus without complication ([place])   . Lymphadenopathy   . Neuropathy   . Renal disorder   . Stroke [facility])     Past Surgical History:  Procedure Laterality Date  . CHOLECYSTECTOMY    . TONSILLECTOMY    . TOTAL HIP ARTHROPLASTY      There were no vitals filed for this visit.  Subjective Assessment - 01/02/19 1437    Subjective  Pt doing well in general without any reports of pain upon arrival. No specific questions or concerns. Denies any falls since last therapy visit.    Pertinent History  Pt complains of unsteadiness/dizziness that initially began after a CVA in 1981, intensified after a MI in 2017, and that she feels she has become even more unsteady recently.  She decribes it is a lightheaded feeling, stating that she feels it all the time, but it fluctuates.  She states that her symptoms are worse when she hasn't eaten or after Lasix.  She reports that she has had BPPV before, but that she recently was evaluated by an ENT, stating that she did not have BPPV during her evaluation.   She denies any spinning sensations.  She reports 1 bad fall recently that lead to a hospitalization, and since then, she has completed some HH PT and moved in with her daughter due to safety concerns.    Currently in Pain?  No/denies         TREATMENT   Therapeutic Exercise: NuStep x 7mn at L2-3,with SOB and fatigue. 1 rest breaks required today after the first 2.5 minutes due to DOE with SpO2 of 87%. Pt requires approximately 1 minute of pursed lip breathing for SpO2 to return to 94%. Therapist adjusted resistance and intensity throughout intervention to maintain SpO2 in safe ranges; Seated marches with 3# ankle weights (AW) 2 x 15 bilateral; Seated LAQ with 3# AW 2 x 15 bilateral; Seated clams with manual resistance 2 x 15; Seated adductor squeeze with manual resistance 2 x 15; Sit to stand without UE support from elevated mat table for appropriate challenge and to minimize knee pain 2 x 10;   Neuromuscular Reeducation: 6" step taps alternating LE x 10 on each side with faded UE support, attempted no support however pt struggles to perform without maintaining at least 2 finger contact; Airex WBOS eyes closed x 30s; Airex NBOS eyes open x 30s; Airex NBOS eyes closed x 30s; Airex NBOS  horizontal and vertical head turns x 30s; Semitandem balance alternating forward LE without UE support x 30s each; Side stepping without UE support 5' x 4 each direction;   Pt educated throughout session about proper posture and technique with exercises. Improved exercise technique, movement at target joints, use of target muscles after min to mod verbal, visual, tactile cues.    Ptcontinues todemonstrategood motivation throughout today's session.She displays increased DOE today on NuStep with SpO2 dropping and pt requiring rest break. She is able to perform seated exercises as well as sit to stand however elevated table necessary due to weakness and L knee pain. She demonstrates most  difficulty with step taps due to imbalance when supported in single leg stance on LLE.Pt willcontinue tobenefit from skilled PT services to address herdeficits in balance todecrease risk for future falls.                         PT Short Term Goals - 11/09/18 1702      PT SHORT TERM GOAL #1   Title  Pt will be independent with HEP in order to improve strength and balance in order to decrease fall risk and improve function at home.    Time  6    Period  Weeks    Status  New    Target Date  12/21/18        PT Long Term Goals - 11/09/18 1703      PT LONG TERM GOAL #1   Title  Pt will improve BERG to >45 in order to decrease her fall risk    Baseline  11/09/18: 29/56    Time  12    Period  Weeks    Status  New    Target Date  02/01/19      PT LONG TERM GOAL #2   Title  Pt will decrease 5TSTS to <14.8s in order to met normative values for her demographic age range.    Baseline  11/09/18: 29.15s With intermittent use of the wall for steadying and CGA throughout due to unsteadiness (performed in a chair without armrests)    Time  12    Period  Weeks    Status  New    Target Date  02/01/19      PT LONG TERM GOAL #3   Title  Pt will decrease TUG to below 14 seconds/decrease in order to demonstrate decreased fall risk.    Baseline  11/09/2018: 14.22s with a rollator    Time  12    Period  Weeks    Status  New    Target Date  02/01/19      PT LONG TERM GOAL #4   Title  Pt will decrease DHI score by at least 18 points in order to demonstrate clinically significant reduction in disability    Baseline  11/09/18: 70/100    Time  12    Period  Weeks    Status  New    Target Date  02/01/19            Plan - 01/02/19 1441    Clinical Impression Statement  Pt continues to demonstrate good motivation throughout today's session.  She displays increased DOE today on NuStep with SpO2 dropping and pt requiring rest break. She is able to perform seated  exercises as well as sit to stand however elevated table necessary due to weakness and L knee pain. She demonstrates most difficulty with step taps  due to imbalance when supported in single leg stance on LLE. Pt will continue to benefit from skilled PT services to address her deficits in balance to decrease risk for future falls.    Personal Factors and Comorbidities  Age;Behavior Pattern;Comorbidity 3+;Past/Current Experience;Time since onset of injury/illness/exacerbation;Fitness    Comorbidities  hx of CVA, hx of MI, DM, CHF, Anemia, neuropathy, CAD, lymphedema    Examination-Activity Limitations  Lift;Squat;Caring for Others;Locomotion Level;Stairs;Carry;Stand    Examination-Participation Restrictions  Interpersonal Relationship;Cleaning;Community Activity;Driving;Shop    Stability/Clinical Decision Making  Evolving/Moderate complexity    Rehab Potential  Poor    PT Frequency  2x / week    PT Duration  12 weeks    PT Treatment/Interventions  ADLs/Self Care Home Management;Canalith Repostioning;Cryotherapy;Electrical Stimulation;Moist Heat;Traction;Ultrasound;DME Instruction;Gait training;Stair training;Functional mobility training;Therapeutic activities;Therapeutic exercise;Balance training;Neuromuscular re-education;Patient/family education;Manual techniques;Dry needling;Taping;Vestibular;Spinal Manipulations;Joint Manipulations;Energy conservation;Aquatic Therapy;Iontophoresis 47m/ml Dexamethasone    PT Next Visit Plan  Updated outcome measures, goals, and progress note; progress strength and balance    PT Home Exercise Plan  [id number]    Consulted and Agree with Plan of Care  Patient;Family member/caregiver    Family Member Consulted  daughter       Patient will benefit from skilled therapeutic intervention in order to improve the following deficits and impairments:  Abnormal gait, Decreased balance, Decreased endurance, Decreased mobility, Difficulty walking, Dizziness, Decreased activity  tolerance, Decreased coordination, Decreased strength  Visit Diagnosis: Unsteadiness on feet  Muscle weakness (generalized)  Other abnormalities of gait and mobility     Problem List Patient Active Problem List   Diagnosis Date Noted  . Cellulitis of leg, right 08/28/2018  . Diarrhea 08/28/2018  . Opacity of lung on imaging study 08/28/2018  . Anemia of chronic disease 08/28/2018  . [place] 08/28/[place]  . Essential hypertension 08/28/2018  . CAD (coronary artery disease) 08/28/2018  . Diabetes mellitus [place] 2, [place] 08/28/2018  . CKD (chronic kidney disease) stage 3, GFR 30-59 ml/min 08/28/2018   JPhillips GroutPT, DPT, GCS  Daune Divirgilio 01/02/2019, 5:17 PM  [place] [facility] [address]RDong[address] [place] [address]Phone: [phone]  Fax:  [phone] Name: Gail DoloresMRN: [id number] of Birth: [birth year]

## 2019-01-04 ENCOUNTER — Other Ambulatory Visit: Payer: Self-pay

## 2019-01-04 ENCOUNTER — Ambulatory Visit: Payer: Medicare Other

## 2019-01-04 DIAGNOSIS — R2681 Unsteadiness on feet: Secondary | ICD-10-CM | POA: Diagnosis not present

## 2019-01-04 DIAGNOSIS — R2689 Other abnormalities of gait and mobility: Secondary | ICD-10-CM

## 2019-01-04 DIAGNOSIS — M6281 Muscle weakness (generalized): Secondary | ICD-10-CM

## 2019-01-04 NOTE — Therapy (Signed)
[place] MAIN [facility] SERVICES [address] [place], [place], [address] Phone: [phone]   Fax:  [phone]  Physical Therapy Progress Note   Dates of reporting period  11/09/18   to   01/04/19  Patient Details  Name: Gail Page MRN: [id number] Date of Birth: March 26, 1930 Referring Provider (PT): Dr. Carloyn Manner   Encounter Date: 01/04/2019  PT End of Session - 01/04/19 0807    Visit Number  10    Number of Visits  25    Date for PT Re-Evaluation  02/01/19    Authorization Type  eval: 11/09/18    PT Start Time  0800    PT Stop Time  0845    PT Time Calculation (min)  45 min    Equipment Utilized During Treatment  Gait belt    Activity Tolerance  Patient tolerated treatment well;Patient limited by fatigue    Behavior During Therapy  [facility] for tasks assessed/performed       Past Medical History:  Diagnosis Date  . Anemia   . CHF (congestive heart failure) ([place])   . Coronary artery disease   . Diabetes mellitus without complication ([place])   . Lymphadenopathy   . Neuropathy   . Renal disorder   . Stroke [facility])     Past Surgical History:  Procedure Laterality Date  . CHOLECYSTECTOMY    . TONSILLECTOMY    . TOTAL HIP ARTHROPLASTY      There were no vitals filed for this visit.  Subjective Assessment - 01/04/19 0806    Subjective  Pt doing well in general. She reports continued mild chronic L knee pain when walking into rehab gym. No specific questions or concerns. Denies any falls since last therapy visit.    Pertinent History  Pt complains of unsteadiness/dizziness that initially began after a CVA in 1981, intensified after a MI in 2017, and that she feels she has become even more unsteady recently.  She decribes it is a lightheaded feeling, stating that she feels it all the time, but it fluctuates.  She states that her symptoms are worse when she hasn't eaten or after Lasix.  She reports that she has had BPPV before, but that  she recently was evaluated by an ENT, stating that she did not have BPPV during her evaluation.  She denies any spinning sensations.  She reports 1 bad fall recently that lead to a hospitalization, and since then, she has completed some HH PT and moved in with her daughter due to safety concerns.    Currently in Pain?  Yes    Pain Score  --   Doesn't rate   Pain Location  Knee    Pain Orientation  Left    Pain Descriptors / Indicators  Aching    Pain Type  Chronic pain    Pain Frequency  Intermittent       TREATMENT   Ther-ex  Updated outcome measures and goals with patient (see below)    Results Comments  BERG 40/56 Fall risk, in need of intervention  TUG 14.0s; Fall risk, slow turns with rollator when returning to chair  5TSTS 21.6s With one LOB and CGA throughout due to unsteadiness   10 Meter Gait Speed Self-selected: 12.2s self = 0.82 m/s; Fastest: 10.2s = 0.98 m/s with rollator Below normative values for full community ambulation  DHI 66/100; High perception of handicap    NuStep x71mn at L1-2,with SOB and fatigue however less than last session and  no rest breaks required today. SpO2 at 89% at end of 5 minutes. Therapist adjusted resistance and intensity throughout intervention and instructed pt in pursed lip breathing at the end for 1 minute until SpO2 recovers to 96% Seated marches with 3# ankle weights (AW) x 15 bilateral; Seated LAQ with 3# AW x 15 RLE, x 12 LLE (limited secondary to increase in knee pain); Seated clams with red tband resistance 2 x 15; Seated adductor squeeze with ball 2 x 15; Seated heel railses with manual resistance x 15;   Pt educated throughout session about proper posture and technique with exercises. Improved exercise technique, movement at target joints, use of target muscles after min to mod verbal, visual, tactile cues.    Ptcontinues todemonstrategood motivation throughout today's session. Updated outcome measures and goals with  patient today.She demonstrates improvement in all of her outcome measures today including the TUG, 5TSTS, 61mgait speed, DHI, and BERG. Her most dramatic improvement was her BERG increasing from 29/56 at initial evaluation to 40/56 today. She continues to lack cardiopulmonary endurance and her L knee pain is somewhat limiting her ability to exercise. She willcontinue tobenefit from skilled PT services to address herdeficits in balance todecrease risk for future falls.     [facility] - 01/04/19 0821      Berg Balance Test   Sit to Stand  Able to stand without using hands and stabilize independently    Standing Unsupported  Able to stand safely 2 minutes    Sitting with Back Unsupported but Feet Supported on Floor or Stool  Able to sit safely and securely 2 minutes    Stand to Sit  Sits safely with minimal use of hands    Transfers  Able to transfer safely, definite need of hands    Standing Unsupported with Eyes Closed  Able to stand 10 seconds with supervision    Standing Unsupported with Feet Together  Able to place feet together independently and stand for 1 minute with supervision    From Standing, Reach Forward with Outstretched Arm  Can reach forward >12 cm safely (5")    From Standing Position, Pick up Object from [place] pick up shoe, needs supervision    From Standing Position, Turn to Look Behind Over each Shoulder  Looks behind one side only/other side shows less weight shift    Turn 360 Degrees  Needs close supervision or verbal cueing    Standing Unsupported, Alternately Place Feet on Step/Stool  Able to complete 4 steps without aid or supervision    Standing Unsupported, One Foot in Front  Able to take small step independently and hold 30 seconds    Standing on One Leg  Tries to lift leg/unable to hold 3 seconds but remains standing independently    Total Score  40               PT Short Term Goals - 01/04/19 0835      PT SHORT TERM GOAL #1    Title  Pt will be independent with HEP in order to improve strength and balance in order to decrease fall risk and improve function at home.    Time  6    Period  Weeks    Status  Achieved    Target Date  12/21/18        PT Long Term Goals - 01/04/19 0835      PT LONG TERM GOAL #1   Title  Pt will  improve BERG to >45 in order to decrease her fall risk    Baseline  11/09/18: 29/56; 01/04/19: 40/56    Time  12    Period  Weeks    Status  Partially Met    Target Date  02/01/19      PT LONG TERM GOAL #2   Title  Pt will decrease 5TSTS to <14.8s in order to met normative values for her demographic age range.    Baseline  11/09/18: 29.15s With intermittent use of the wall for steadying and CGA throughout due to unsteadiness (performed in a chair without armrests); 01/04/19: 21.6s one LOB and CGA throughout    Time  12    Period  Weeks    Status  Partially Met    Target Date  02/01/19      PT LONG TERM GOAL #3   Title  Pt will decrease TUG to below 14 seconds/decrease in order to demonstrate decreased fall risk.    Baseline  11/09/2018: 14.22s with a rollator; 01/04/19: 14.0s with a rollator    Time  12    Period  Weeks    Status  Partially Met    Target Date  02/01/19      PT LONG TERM GOAL #4   Title  Pt will decrease DHI score by at least 18 points in order to demonstrate clinically significant reduction in disability    Baseline  11/09/18: 70/100; 01/04/19: 66/100    Time  12    Period  Weeks    Status  Partially Met    Target Date  02/01/19            Plan - 01/04/19 0834    Clinical Impression Statement  Pt continues to demonstrate good motivation throughout today's session. Updated outcome measures and goals with patient today.  She demonstrates improvement in all of her outcome measures today including the TUG, 5TSTS, 66mgait speed, DHI, and BERG. Her most dramatic improvement was her BERG increasing from 29/56 at initial evaluation to 40/56 today. She continues to  lack cardiopulmonary endurance and her L knee pain is somewhat limiting her ability to exercise. She will continue to benefit from skilled PT services to address her deficits in balance to decrease risk for future falls.    Personal Factors and Comorbidities  Age;Behavior Pattern;Comorbidity 3+;Past/Current Experience;Time since onset of injury/illness/exacerbation;Fitness    Comorbidities  hx of CVA, hx of MI, DM, CHF, Anemia, neuropathy, CAD, lymphedema    Examination-Activity Limitations  Lift;Squat;Caring for Others;Locomotion Level;Stairs;Carry;Stand    Examination-Participation Restrictions  Interpersonal Relationship;Cleaning;Community Activity;Driving;Shop    Stability/Clinical Decision Making  Evolving/Moderate complexity    Rehab Potential  Poor    PT Frequency  2x / week    PT Duration  12 weeks    PT Treatment/Interventions  ADLs/Self Care Home Management;Canalith Repostioning;Cryotherapy;Electrical Stimulation;Moist Heat;Traction;Ultrasound;DME Instruction;Gait training;Stair training;Functional mobility training;Therapeutic activities;Therapeutic exercise;Balance training;Neuromuscular re-education;Patient/family education;Manual techniques;Dry needling;Taping;Vestibular;Spinal Manipulations;Joint Manipulations;Energy conservation;Aquatic Therapy;Iontophoresis 460mml Dexamethasone    PT Next Visit Plan  progress strength and balance    PT Home Exercise Plan  [email or web address]    Consulted and Agree with Plan of Care  Patient;Family member/caregiver    Family Member Consulted  daughter       Patient will benefit from skilled therapeutic intervention in order to improve the following deficits and impairments:  Abnormal gait, Decreased balance, Decreased endurance, Decreased mobility, Difficulty walking, Dizziness, Decreased activity tolerance, Decreased coordination, Decreased strength  Visit Diagnosis: Unsteadiness on feet  Muscle weakness (generalized)  Other abnormalities of gait and  mobility     Problem List Patient Active Problem List   Diagnosis Date Noted  . Cellulitis of leg, right 08/28/2018  . Diarrhea 08/28/2018  . Opacity of lung on imaging study 08/28/2018  . Anemia of chronic disease 08/28/2018  . Chronic diastolic CHF (congestive heart failure) ([place]) 08/28/2018  . Essential hypertension 08/28/2018  . CAD (coronary artery disease) 08/28/2018  . Diabetes mellitus type 2, diet-controlled ([place]) 08/28/2018  . CKD (chronic kidney disease) stage 3, GFR 30-59 ml/min 08/28/2018   Phillips Grout PT, DPT, GCS  Jaciel Diem 01/04/2019, 8:47 AM  [place] MAIN [facility] SERVICES [address] [place], [place], [address] Phone: [phone]   Fax:  [phone]  Name: Phallon Haydu MRN: [id number] Date of Birth: May 29, 1930

## 2019-01-09 ENCOUNTER — Ambulatory Visit: Payer: Medicare Other

## 2019-01-11 ENCOUNTER — Ambulatory Visit: Payer: Medicare Other

## 2019-01-11 ENCOUNTER — Other Ambulatory Visit: Payer: Self-pay

## 2019-01-11 DIAGNOSIS — M6281 Muscle weakness (generalized): Secondary | ICD-10-CM

## 2019-01-11 DIAGNOSIS — R2681 Unsteadiness on feet: Secondary | ICD-10-CM

## 2019-01-11 DIAGNOSIS — R2689 Other abnormalities of gait and mobility: Secondary | ICD-10-CM

## 2019-01-11 NOTE — Therapy (Signed)
[place] MAIN [facility] SERVICES [address] [place], [place], [address] Phone: [phone]   Fax:  [phone]  Physical Therapy Treatment  Patient Details  Name: Gail Page MRN: [id number] Date of Birth: 05/24/1930 Referring Provider (PT): Dr. Carloyn Manner   Encounter Date: 01/11/2019  PT End of Session - 01/11/19 0806    Visit Number  11    Number of Visits  25    Date for PT Re-Evaluation  02/01/19    Authorization Type  eval: 11/09/18    PT Start Time  0801    PT Stop Time  0847    PT Time Calculation (min)  46 min    Equipment Utilized During Treatment  Gait belt    Activity Tolerance  Patient tolerated treatment well;Patient limited by fatigue    Behavior During Therapy  [facility] for tasks assessed/performed       Past Medical History:  Diagnosis Date  . Anemia   . CHF (congestive heart failure) ([place])   . Coronary artery disease   . Diabetes mellitus without complication ([place])   . Lymphadenopathy   . Neuropathy   . Renal disorder   . Stroke [facility])     Past Surgical History:  Procedure Laterality Date  . CHOLECYSTECTOMY    . TONSILLECTOMY    . TOTAL HIP ARTHROPLASTY      There were no vitals filed for this visit.  Subjective Assessment - 01/11/19 0804    Subjective  Patient reports she was working out yesterday and is feeling a little sore today but no pain. Had a hard time getting ankle weights on and off. No falls or stumbles since last session.    Pertinent History  Pt complains of unsteadiness/dizziness that initially began after a CVA in 1981, intensified after a MI in 2017, and that she feels she has become even more unsteady recently.  She decribes it is a lightheaded feeling, stating that she feels it all the time, but it fluctuates.  She states that her symptoms are worse when she hasn't eaten or after Lasix.  She reports that she has had BPPV before, but that she recently was evaluated by an ENT, stating that  she did not have BPPV during her evaluation.  She denies any spinning sensations.  She reports 1 bad fall recently that lead to a hospitalization, and since then, she has completed some HH PT and moved in with her daughter due to safety concerns.    Currently in Pain?  No/denies           TREATMENT   Therapeutic Exercise: NuStep x16mn at L2-3,with SOB, Sp02 91% after 5 minutes, cueing for purse lipped breathing.   RTB side stepping with band around knees 6x length of // bars RTB terminal knee flexion/extension 10x LLE  Seated: RTB abduction single limb at a time; challenging to stabilize with opp LE 15x each LE RTB hamstring curls 15x each LE RTB marching with arms crossed and upright posture, cueing for forward trunk lean for stabilization 15x Rainbow ball IR/ER 15x each LE; challenging LLE Rainbow ball LAQ with adduction squeeze of ball between feet, cueing for decreasing arc of motion to decrease pain in L knee 10x  Neuromuscular Reeducation: airex pad 6" step taps alternating LE x 12 on each side; SUE support; noted knee adduction/collapse of LLE on single limb stance airex pad; static stand 30 seconds x 2 trials with focus on breathing for reduction of tremors airex pad: horizontal  and vertical head turns 30 seconds each direction x 2 trials airex pad: opp LE on 6' step for modified tandem stance 2x 60 seconds each LE, more challenging for LLE> RLE Seated balloon taps reaching inside/outside BOS for reactions and coordination x 2 minutes   Pt educated throughout session about proper posture and technique with exercises. Improved exercise technique, movement at target joints, use of target muscles after min to mod verbal, visual, tactile cues.       Hep due to request for more band workouts at home:   Access Code: [email or web address]  URL: [email or web address]  Date: 01/11/2019  Prepared by: Janna Arch   Exercises Seated March with Resistance - 10 reps - 2  sets - 5 hold - 1x daily - 7x weekly Seated Hip Abduction with Resistance - 10 reps - 2 sets - 5 hold - 1x daily - 7x weekly Seated Knee Extension with Resistance - 10 reps - 2 sets - 5 hold - 1x daily - 7x weekly Seated Hamstring Curls with Resistance - 10 reps - 2 sets - 5 hold - 1x daily - 7x weekly Seated Hip Internal Rotation with Resistance - 10 reps - 2 sets - 5 hold - 1x daily - 7x weekly Seated Ankle Plantar Flexion with Resistance Loop - 10 reps - 2 sets - 5 hold - 1x daily - 7x weekly              PT Education - 01/11/19 0805    Education Details  exercise technique, body mechanics    Person(s) Educated  Patient    Methods  Explanation;Demonstration;Tactile cues;Verbal cues    Comprehension  Verbalized understanding;Verbal cues required;Returned demonstration;Tactile cues required       PT Short Term Goals - 01/04/19 0835      PT SHORT TERM GOAL #1   Title  Pt will be independent with HEP in order to improve strength and balance in order to decrease fall risk and improve function at home.    Time  6    Period  Weeks    Status  Achieved    Target Date  12/21/18        PT Long Term Goals - 01/04/19 0835      PT LONG TERM GOAL #1   Title  Pt will improve BERG to >45 in order to decrease her fall risk    Baseline  11/09/18: 29/56; 01/04/19: 40/56    Time  12    Period  Weeks    Status  Partially Met    Target Date  02/01/19      PT LONG TERM GOAL #2   Title  Pt will decrease 5TSTS to <14.8s in order to met normative values for her demographic age range.    Baseline  11/09/18: 29.15s With intermittent use of the wall for steadying and CGA throughout due to unsteadiness (performed in a chair without armrests); 01/04/19: 21.6s one LOB and CGA throughout    Time  12    Period  Weeks    Status  Partially Met    Target Date  02/01/19      PT LONG TERM GOAL #3   Title  Pt will decrease TUG to below 14 seconds/decrease in order to demonstrate decreased fall  risk.    Baseline  11/09/2018: 14.22s with a rollator; 01/04/19: 14.0s with a rollator    Time  12    Period  Weeks    Status  Partially Met  Target Date  02/01/19      PT LONG TERM GOAL #4   Title  Pt will decrease DHI score by at least 18 points in order to demonstrate clinically significant reduction in disability    Baseline  11/09/18: 70/100; 01/04/19: 66/100    Time  12    Period  Weeks    Status  Partially Met    Target Date  02/01/19            Plan - 01/11/19 0955    Clinical Impression Statement  Patient is very pleasant and eager to participate in physical therapy. She demonstrates LLE weakness and valgus drift with fatigue resulting in limited stability in single limb support stance. Sp02 monitored throughout session with patient maintaining levels >90% the entire session with occasional rest breaks for increase above >94% .Pt will continue to benefit from skilled PT services to address her deficits in balance to decrease risk for future falls.    Personal Factors and Comorbidities  Age;Behavior Pattern;Comorbidity 3+;Past/Current Experience;Time since onset of injury/illness/exacerbation;Fitness    Comorbidities  hx of CVA, hx of MI, DM, CHF, Anemia, neuropathy, CAD, lymphedema    Examination-Activity Limitations  Lift;Squat;Caring for Others;Locomotion Level;Stairs;Carry;Stand    Examination-Participation Restrictions  Interpersonal Relationship;Cleaning;Community Activity;Driving;Shop    Stability/Clinical Decision Making  Evolving/Moderate complexity    Rehab Potential  Poor    PT Frequency  2x / week    PT Duration  12 weeks    PT Treatment/Interventions  ADLs/Self Care Home Management;Canalith Repostioning;Cryotherapy;Electrical Stimulation;Moist Heat;Traction;Ultrasound;DME Instruction;Gait training;Stair training;Functional mobility training;Therapeutic activities;Therapeutic exercise;Balance training;Neuromuscular re-education;Patient/family education;Manual  techniques;Dry needling;Taping;Vestibular;Spinal Manipulations;Joint Manipulations;Energy conservation;Aquatic Therapy;Iontophoresis 11m/ml Dexamethasone    PT Next Visit Plan  progress strength and balance    PT Home Exercise Plan  [email or web address]    Consulted and Agree with Plan of Care  Patient;Family member/caregiver    Family Member Consulted  daughter       Patient will benefit from skilled therapeutic intervention in order to improve the following deficits and impairments:  Abnormal gait, Decreased balance, Decreased endurance, Decreased mobility, Difficulty walking, Dizziness, Decreased activity tolerance, Decreased coordination, Decreased strength  Visit Diagnosis: Unsteadiness on feet  Muscle weakness (generalized)  Other abnormalities of gait and mobility     Problem List Patient Active Problem List   Diagnosis Date Noted  . Cellulitis of leg, right 08/28/2018  . Diarrhea 08/28/2018  . Opacity of lung on imaging study 08/28/2018  . Anemia of chronic disease 08/28/2018  . Chronic diastolic [place] ([place]) [place] 08/28/2018  . Essential hypertension 08/28/2018  . CAD (coronary artery disease) 08/28/2018  . Diabetes mellitus type 2, diet-controlled ([place] 08/28/2018  . CKD (chronic kidney disease) stage 3, GFR 30-59 ml/min 08/28/2018   MJanna Arch PT, DPT   01/11/2019, 9:57 AM  [place] [facility] [address] [address]Phone: [phone]  Fax:  [phone] Name: YZoraya FiorenzaMRN: [id number] of Birth: 712-08-1930

## 2019-01-16 ENCOUNTER — Ambulatory Visit: Payer: Medicare Other

## 2019-01-16 ENCOUNTER — Other Ambulatory Visit: Payer: Self-pay

## 2019-01-16 VITALS — BP 152/66 | HR 65

## 2019-01-16 DIAGNOSIS — M6281 Muscle weakness (generalized): Secondary | ICD-10-CM

## 2019-01-16 DIAGNOSIS — R2689 Other abnormalities of gait and mobility: Secondary | ICD-10-CM

## 2019-01-16 DIAGNOSIS — R2681 Unsteadiness on feet: Secondary | ICD-10-CM

## 2019-01-16 NOTE — Therapy (Signed)
[place] MAIN [facility] SERVICES [address] [place], [place], [address] Phone: [phone]   Fax:  [phone]  Physical Therapy Treatment  Patient Details  Name: Gail Page MRN: [id number] Date of Birth: 02/09/1930 Referring Provider (PT): Dr. Carloyn Manner   Encounter Date: 01/16/2019  PT End of Session - 01/16/19 1439    Visit Number  12    Number of Visits  25    Date for PT Re-Evaluation  02/01/19    Authorization Type  eval: 11/09/18    PT Start Time  1430    PT Stop Time  1515    PT Time Calculation (min)  45 min    Equipment Utilized During Treatment  Gait belt    Activity Tolerance  Patient tolerated treatment well    Behavior During Therapy  [facility] for tasks assessed/performed       Past Medical History:  Diagnosis Date  . Anemia   . CHF (congestive heart failure) ([place])   . Coronary artery disease   . Diabetes mellitus without complication ([place])   . Lymphadenopathy   . Neuropathy   . Renal disorder   . Stroke [facility])     Past Surgical History:  Procedure Laterality Date  . CHOLECYSTECTOMY    . TONSILLECTOMY    . TOTAL HIP ARTHROPLASTY      Vitals:   01/16/19 1435  BP: (!) 152/66  Pulse: 65  SpO2: 96%    Subjective Assessment - 01/16/19 1434    Subjective  Patient reports she is doing well today. Her L knee is feeling pretty good and she denies any resting pain upon arrival. She continues to have some pain with WB through LLE. No falls or stumbles since last session. No specific questions or concerns currently.    Pertinent History  Pt complains of unsteadiness/dizziness that initially began after a CVA in 1981, intensified after a MI in 2017, and that she feels she has become even more unsteady recently.  She decribes it is a lightheaded feeling, stating that she feels it all the time, but it fluctuates.  She states that her symptoms are worse when she hasn't eaten or after Lasix.  She reports that she has had  BPPV before, but that she recently was evaluated by an ENT, stating that she did not have BPPV during her evaluation.  She denies any spinning sensations.  She reports 1 bad fall recently that lead to a hospitalization, and since then, she has completed some HH PT and moved in with her daughter due to safety concerns.    Currently in Pain?  No/denies         TREATMENT   Therapeutic Exercise Seated marches with 3# ankle weights (AW) x 15 bilateral; Seated LAQ with 3# AW x 15 bilateral; Seated clams with green tband 3s hold x 15; Seated adductor ball squeeze 3s hold x 15; Seated heel raises with manual resistance 3s hold x 15 Sit to stand from elevated blue mat table, no UE support, x 10 with ball between knees for adductor squeeze to decrease L knee pain; Attempted hooklying SLR with and without ankle weights but too difficult with R anterior hip pain so discontinued; Hooklying marching x 15 bilateral; Hooklying bridges with 3s hold x 15;   Neuromuscular Reeducation: airex pad 6" step taps alternating LE x 10 on each side; SUE support noted in LLE SLS as well as knee adduction/collapse; airex pad WBOS and NBOS static stand eyes closed x  30 seconds each; airex pad WBOS horizontal and vertical head turns 30 seconds each direction; airex pad NBOS horizontal and vertical head turns 30 seconds each direction; Side stepping without UE support 6' x 4; Heel/toe raises without UE support x 10 each;   Pt educated throughout session about proper posture and technique with exercises. Improved exercise technique, movement at target joints, use of target muscles after min to mod verbal, visual, tactile cues.   Ptcontinues todemonstrategood motivation throughout today's session. Continued to work on LE strengthening in sitting and hooklying to minimize L knee pain. She continues to be limited in L single leg stance due to increase in knee pain. She is able to perform balance exercises again  today on Airex pad and struggles in narrow stance. Pt willcontinue tobenefit from skilled PT services to address herdeficits in balance todecrease risk for future falls.                    PT Short Term Goals - 01/04/19 0835      PT SHORT TERM GOAL #1   Title  Pt will be independent with HEP in order to improve strength and balance in order to decrease fall risk and improve function at home.    Time  6    Period  Weeks    Status  Achieved    Target Date  12/21/18        PT Long Term Goals - 01/04/19 0835      PT LONG TERM GOAL #1   Title  Pt will improve BERG to >45 in order to decrease her fall risk    Baseline  11/09/18: 29/56; 01/04/19: 40/56    Time  12    Period  Weeks    Status  Partially Met    Target Date  02/01/19      PT LONG TERM GOAL #2   Title  Pt will decrease 5TSTS to <14.8s in order to met normative values for her demographic age range.    Baseline  11/09/18: 29.15s With intermittent use of the wall for steadying and CGA throughout due to unsteadiness (performed in a chair without armrests); 01/04/19: 21.6s one LOB and CGA throughout    Time  12    Period  Weeks    Status  Partially Met    Target Date  02/01/19      PT LONG TERM GOAL #3   Title  Pt will decrease TUG to below 14 seconds/decrease in order to demonstrate decreased fall risk.    Baseline  11/09/2018: 14.22s with a rollator; 01/04/19: 14.0s with a rollator    Time  12    Period  Weeks    Status  Partially Met    Target Date  02/01/19      PT LONG TERM GOAL #4   Title  Pt will decrease DHI score by at least 18 points in order to demonstrate clinically significant reduction in disability    Baseline  11/09/18: 70/100; 01/04/19: 66/100    Time  12    Period  Weeks    Status  Partially Met    Target Date  02/01/19            Plan - 01/16/19 1439    Clinical Impression Statement  Pt continues to demonstrate good motivation throughout today's session. Continued to work on  LE strengthening in sitting and hooklying to minimize L knee pain. She continues to be limited in L single leg stance  due to increase in knee pain. She is able to perform balance exercises again today on Airex pad and struggles in narrow stance. Pt will continue to benefit from skilled PT services to address her deficits in balance to decrease risk for future falls.    Personal Factors and Comorbidities  Age;Behavior Pattern;Comorbidity 3+;Past/Current Experience;Time since onset of injury/illness/exacerbation;Fitness    Comorbidities  hx of CVA, hx of MI, DM, CHF, Anemia, neuropathy, CAD, lymphedema    Examination-Activity Limitations  Lift;Squat;Caring for Others;Locomotion Level;Stairs;Carry;Stand    Examination-Participation Restrictions  Interpersonal Relationship;Cleaning;Community Activity;Driving;Shop    Stability/Clinical Decision Making  Evolving/Moderate complexity    Rehab Potential  Poor    PT Frequency  2x / week    PT Duration  12 weeks    PT Treatment/Interventions  ADLs/Self Care Home Management;Canalith Repostioning;Cryotherapy;Electrical Stimulation;Moist Heat;Traction;Ultrasound;DME Instruction;Gait training;Stair training;Functional mobility training;Therapeutic activities;Therapeutic exercise;Balance training;Neuromuscular re-education;Patient/family education;Manual techniques;Dry needling;Taping;Vestibular;Spinal Manipulations;Joint Manipulations;Energy conservation;Aquatic Therapy;Iontophoresis 75m/ml Dexamethasone    PT Next Visit Plan  progress strength and balance    PT Home Exercise Plan  [id number]    Consulted and Agree with Plan of Care  Patient;Family member/caregiver    Family Member Consulted  daughter       Patient will benefit from skilled therapeutic intervention in order to improve the following deficits and impairments:  Abnormal gait, Decreased balance, Decreased endurance, Decreased mobility, Difficulty walking, Dizziness, Decreased activity tolerance,  Decreased coordination, Decreased strength  Visit Diagnosis: Unsteadiness on feet  Muscle weakness (generalized)  Other abnormalities of gait and mobility     Problem List Patient Active Problem List   Diagnosis Date Noted  . Cellulitis of leg, right 08/28/2018  . Diarrhea 08/28/2018  . Opacity of lung on imaging study 08/28/2018  . Anemia of chronic disease 08/28/2018  . Chronic diastolic CHF (congestive heart failure) ([place] 08/28/2018  . Essential hypertension 08/28/2018  . CAD (coronary artery disease) 08/28/2018  . Diabetes mellitus type 2, diet-controlled ([place] 08/28/2018  . CKD (chronic kidney disease) stage 3, GFR 30-59 ml/min 08/28/2018   JPhillips GroutPT, DPT, GCS  Mickle Campton 01/16/2019, 4:00 PM  [address]MAIN [facility] [address] [place] [address]Phone: [phone]  Fax:  [phone] Name: YSusana DuellMRN: [id number] of Birth: [birth year]

## 2019-01-18 ENCOUNTER — Other Ambulatory Visit: Payer: Self-pay

## 2019-01-18 ENCOUNTER — Ambulatory Visit: Payer: Medicare Other

## 2019-01-18 DIAGNOSIS — R2681 Unsteadiness on feet: Secondary | ICD-10-CM

## 2019-01-18 DIAGNOSIS — M6281 Muscle weakness (generalized): Secondary | ICD-10-CM

## 2019-01-18 DIAGNOSIS — R2689 Other abnormalities of gait and mobility: Secondary | ICD-10-CM

## 2019-01-18 NOTE — Therapy (Signed)
[place] MAIN [facility] SERVICES [address] [place], [place], [address] Phone: [phone]   Fax:  [phone]  Physical Therapy Treatment  Patient Details  Name: Gail Page MRN: [id number] Date of Birth: [birth year] Referring Provider (PT): Dr. Carloyn Manner   Encounter Date: 01/18/2019  PT End of Session - 01/18/19 0804    Visit Number  13    Number of Visits  25    Date for PT Re-Evaluation  02/01/19    Authorization Type  eval: 11/09/18    PT Start Time  0800    PT Stop Time  0845    PT Time Calculation (min)  45 min    Equipment Utilized During Treatment  Gait belt    Activity Tolerance  Patient tolerated treatment well    Behavior During Therapy  [facility] for tasks assessed/performed       Past Medical History:  Diagnosis Date  . Anemia   . CHF (congestive heart failure) ([place])   . Coronary artery disease   . Diabetes mellitus without complication ([place])   . Lymphadenopathy   . Neuropathy   . Renal disorder   . Stroke [facility])     Past Surgical History:  Procedure Laterality Date  . CHOLECYSTECTOMY    . TONSILLECTOMY    . TOTAL HIP ARTHROPLASTY      There were no vitals filed for this visit.  Subjective Assessment - 01/18/19 0758    Subjective  Patient reports she is doing well today. She continues to have her chronic L knee pain. She continues to have some pain with WB through LLE. No falls or stumbles since last session. No specific questions or concerns currently.    Pertinent History  Pt complains of unsteadiness/dizziness that initially began after a CVA in 1981, intensified after a MI in 2017, and that she feels she has become even more unsteady recently.  She decribes it is a lightheaded feeling, stating that she feels it all the time, but it fluctuates.  She states that her symptoms are worse when she hasn't eaten or after Lasix.  She reports that she has had BPPV before, but that she recently was evaluated by an ENT,  stating that she did not have BPPV during her evaluation.  She denies any spinning sensations.  She reports 1 bad fall recently that lead to a hospitalization, and since then, she has completed some HH PT and moved in with her daughter due to safety concerns.    Currently in Pain?  Yes    Pain Score  3     Pain Location  Knee    Pain Orientation  Left    Pain Descriptors / Indicators  Aching    Pain Type  Chronic pain    Pain Onset  More than a month ago    Pain Frequency  Constant          TREATMENT   Therapeutic Exercise NuStep L3 x 5 minutes for warm-up during history, At end of warm-up SpO2: 92% (3 minutes unbilled); Seated marches with 3# ankle weights (AW) x 15 bilateral; Seated LAQ with 3# AW x 15 bilateral; Seated clams with green tband 3s hold x 15; Seated adductor ball squeeze 3s hold x 15; Seated heel raises with manual resistance 3s hold x 15 Sit to stand from regular height chair with Airex on seat, no UE support, x 10 with ball between knees for adductor squeeze to decrease L knee pain; Standing hip abduction  with 3# AW x 15 bilateral; Standing hip extension with 3# AW x 15 bilateral; Standing hip flexion marches with 3# AW x 15 bilateral; Standing hamstring curls with 3# AW x 15 bilateral; Standing heel raises with BUE support, 3s hold at top, x 15;   Neuromuscular Reeducation: Airex balance beam side stepping in // bars with faded UE support 2-0 x 6 lengths; Airex balance beam tandem gait in // bars with faded UE support 2-0 x 6 lengths; 1/2 foam roll (flat side up) static balance x 60s; 1/2 foam roll (flat side up) heel/toe rocking x 60s; 1/2 foam roll (flat side up) tandem balance alternating forward LE x 60s each;   Pt educated throughout session about proper posture and technique with exercises. Improved exercise technique, movement at target joints, use of target muscles after min to mod verbal, visual, tactile cues.   Ptcontinues  todemonstrategood motivation throughout today's session. Continued to work on LE strengthening and incorporated more standing into exercise program without pt reporting significant increase in knee pain. She actually states that her knee feels a little less painful at the end of session compared to when she first arrived. She is able to perform balance exercises again today and incorporated the Airex balance beam as well as the 1/2 foam roll. Pt willcontinue tobenefit from skilled PT services to address herdeficits in balance todecrease risk for future falls.                       PT Short Term Goals - 01/04/19 0835      PT SHORT TERM GOAL #1   Title  Pt will be independent with HEP in order to improve strength and balance in order to decrease fall risk and improve function at home.    Time  6    Period  Weeks    Status  Achieved    Target Date  12/21/18        PT Long Term Goals - 01/04/19 0835      PT LONG TERM GOAL #1   Title  Pt will improve BERG to >45 in order to decrease her fall risk    Baseline  11/09/18: 29/56; 01/04/19: 40/56    Time  12    Period  Weeks    Status  Partially Met    Target Date  02/01/19      PT LONG TERM GOAL #2   Title  Pt will decrease 5TSTS to <14.8s in order to met normative values for her demographic age range.    Baseline  11/09/18: 29.15s With intermittent use of the wall for steadying and CGA throughout due to unsteadiness (performed in a chair without armrests); 01/04/19: 21.6s one LOB and CGA throughout    Time  12    Period  Weeks    Status  Partially Met    Target Date  02/01/19      PT LONG TERM GOAL #3   Title  Pt will decrease TUG to below 14 seconds/decrease in order to demonstrate decreased fall risk.    Baseline  11/09/2018: 14.22s with a rollator; 01/04/19: 14.0s with a rollator    Time  12    Period  Weeks    Status  Partially Met    Target Date  02/01/19      PT LONG TERM GOAL #4   Title  Pt will  decrease DHI score by at least 18 points in order to demonstrate clinically significant reduction  in disability    Baseline  11/09/18: 70/100; 01/04/19: 66/100    Time  12    Period  Weeks    Status  Partially Met    Target Date  02/01/19            Plan - 01/18/19 0804    Clinical Impression Statement  Pt continues to demonstrate good motivation throughout today's session. Continued to work on LE strengthening and incorporated more standing into exercise program without pt reporting significant increase in knee pain. She actually states that her knee feels a little less painful at the end of session compared to when she first arrived. She is able to perform balance exercises again today and incorporated the Airex balance beam as well as the 1/2 foam roll. Pt will continue to benefit from skilled PT services to address her deficits in balance to decrease risk for future falls.    Personal Factors and Comorbidities  Age;Behavior Pattern;Comorbidity 3+;Past/Current Experience;Time since onset of injury/illness/exacerbation;Fitness    Comorbidities  hx of CVA, hx of MI, DM, CHF, Anemia, neuropathy, CAD, lymphedema    Examination-Activity Limitations  Lift;Squat;Caring for Others;Locomotion Level;Stairs;Carry;Stand    Examination-Participation Restrictions  Interpersonal Relationship;Cleaning;Community Activity;Driving;Shop    Stability/Clinical Decision Making  Evolving/Moderate complexity    Rehab Potential  Poor    PT Frequency  2x / week    PT Duration  12 weeks    PT Treatment/Interventions  ADLs/Self Care Home Management;Canalith Repostioning;Cryotherapy;Electrical Stimulation;Moist Heat;Traction;Ultrasound;DME Instruction;Gait training;Stair training;Functional mobility training;Therapeutic activities;Therapeutic exercise;Balance training;Neuromuscular re-education;Patient/family education;Manual techniques;Dry needling;Taping;Vestibular;Spinal Manipulations;Joint Manipulations;Energy  conservation;Aquatic Therapy;Iontophoresis 15m/ml Dexamethasone    PT Next Visit Plan  progress strength and balance    PT Home Exercise Plan  [id number]    Consulted and Agree with Plan of Care  Patient;Family member/caregiver    Family Member Consulted  daughter       Patient will benefit from skilled therapeutic intervention in order to improve the following deficits and impairments:  Abnormal gait, Decreased balance, Decreased endurance, Decreased mobility, Difficulty walking, Dizziness, Decreased activity tolerance, Decreased coordination, Decreased strength  Visit Diagnosis: Unsteadiness on feet  Muscle weakness (generalized)  Other abnormalities of gait and mobility     Problem List Patient Active Problem List   Diagnosis Date Noted  . Cellulitis of leg, right 08/28/2018  . Diarrhea 08/28/2018  . Opacity of lung on imaging study 08/28/2018  . Anemia of chronic disease 08/28/2018  . Chronic diastolic CHF (congestive heart failure) ([place] 08/28/2018  . Essential hypertension 08/28/2018  . CAD (coronary artery disease) 08/28/2018  . Diabetes mellitus type 2, diet-controlled ([place] 08/28/2018  . CKD (chronic kidney disease) stage 3, GFR 30-59 ml/min 08/28/2018    JPhillips GroutPT, DPT, GCS  Huprich,Jason 01/18/2019, 9:41 AM  CS[place]fieldMAIN RThe Rehabilitation Institute [facility] St[facility] LouisSERVICES [address] [address]Phone: [phone]  Fax:  [phone] Name: YMykaela ArenaMRN: [id number] of Birth: [birth year]

## 2019-01-23 ENCOUNTER — Ambulatory Visit: Payer: Medicare Other

## 2019-01-23 ENCOUNTER — Other Ambulatory Visit: Payer: Self-pay

## 2019-01-23 DIAGNOSIS — R2689 Other abnormalities of gait and mobility: Secondary | ICD-10-CM

## 2019-01-23 DIAGNOSIS — R2681 Unsteadiness on feet: Secondary | ICD-10-CM | POA: Diagnosis not present

## 2019-01-23 DIAGNOSIS — M6281 Muscle weakness (generalized): Secondary | ICD-10-CM

## 2019-01-23 NOTE — Therapy (Signed)
[place] MAIN [facility] SERVICES [address] [place], [place], [address] Phone: [phone]   Fax:  [phone]  Physical Therapy Treatment  Patient Details  Name: Gail Page MRN: [id number] Date of Birth: [birth year] Referring Provider (PT): Dr. Carloyn Manner   Encounter Date: 01/23/2019  PT End of Session - 01/23/19 1529    Visit Number  14    Number of Visits  25    Date for PT Re-Evaluation  02/01/19    Authorization Type  eval: 11/09/18, Progress note: 01/04/19    PT Start Time  1517    PT Stop Time  1600    PT Time Calculation (min)  43 min    Equipment Utilized During Treatment  Gait belt    Activity Tolerance  Patient tolerated treatment well    Behavior During Therapy  [facility] for tasks assessed/performed       Past Medical History:  Diagnosis Date  . Anemia   . CHF (congestive heart failure) ([place])   . Coronary artery disease   . Diabetes mellitus without complication ([place])   . Lymphadenopathy   . Neuropathy   . Renal disorder   . Stroke [facility])     Past Surgical History:  Procedure Laterality Date  . CHOLECYSTECTOMY    . TONSILLECTOMY    . TOTAL HIP ARTHROPLASTY      There were no vitals filed for this visit.  Subjective Assessment - 01/23/19 1542    Subjective  Patient reports she is doing well today. She continues to have her chronic L knee pain but it is not bothering her upon arrival today. No falls however she did stumble last night when reaching to put up some clothes in her closet. No specific questions or concerns currently.    Pertinent History  Pt complains of unsteadiness/dizziness that initially began after a CVA in 1981, intensified after a MI in 2017, and that she feels she has become even more unsteady recently.  She decribes it is a lightheaded feeling, stating that she feels it all the time, but it fluctuates.  She states that her symptoms are worse when she hasn't eaten or after Lasix.  She reports that  she has had BPPV before, but that she recently was evaluated by an ENT, stating that she did not have BPPV during her evaluation.  She denies any spinning sensations.  She reports 1 bad fall recently that lead to a hospitalization, and since then, she has completed some HH PT and moved in with her daughter due to safety concerns.    Currently in Pain?  No/denies         TREATMENT   Therapeutic Exercise NuStep L3/4 x 5 minutes for warm-up during history, resisted  At end of warm-up SpO2: 92% (3 minutes unbilled); Seated marches with 4# ankle weights (AW) x 20 bilateral; Seated LAQ with 4# AW x 20 bilateral; Seated clams with green tband 3s hold x 25; Seated adductor ball squeeze 3s hold x 25; Seated heel raises with manual resistance 3s hold x 20 bilateral; Seated HS curls with green tband resistance x 20 bilateral; Standing hip abduction with 3# AW x 20 bilateral; Standing hip flexion marches with 3# AW x 20 bilateral, discontinued further standing exercises due to increase in L knee pain; Standing heel raises with BUE support, 3s hold at top x 20;   Neuromuscular Reeducation: Airex balance beam side stepping in // bars with faded UE support 2-0 x 4 lengths; Airex  balance beam tandem gait in // bars with faded UE support 2-0 x 4 lengths; 6" hurdle steps alternating LE x 10 on each side; Rockerboard A/P orientation static balance x 60s; Rockerboard A/P orientation heel/toe rocking x 60s;   Pt educated throughout session about proper posture and technique with exercises. Improved exercise technique, movement at target joints, use of target muscles after min to mod verbal, visual, tactile cues.   Ptcontinues todemonstrategood motivation throughout today's session.Continued to work on LE strengthening but standing exercises remain limited due to increase in knee pain. Increased ankle weight resistance for added challenge to LE strength. Pt is demonstrating improving gait  speed when entering/exiting session. She is able to perform balance exercises again today and incorporated the rockerboard into her program.Pt willcontinue tobenefit from skilled PT services to address herdeficits in balance todecrease risk for future falls.                        PT Short Term Goals - 01/04/19 0835      PT SHORT TERM GOAL #1   Title  Pt will be independent with HEP in order to improve strength and balance in order to decrease fall risk and improve function at home.    Time  6    Period  Weeks    Status  Achieved    Target Date  12/21/18        PT Long Term Goals - 01/04/19 0835      PT LONG TERM GOAL #1   Title  Pt will improve BERG to >45 in order to decrease her fall risk    Baseline  11/09/18: 29/56; 01/04/19: 40/56    Time  12    Period  Weeks    Status  Partially Met    Target Date  02/01/19      PT LONG TERM GOAL #2   Title  Pt will decrease 5TSTS to <14.8s in order to met normative values for her demographic age range.    Baseline  11/09/18: 29.15s With intermittent use of the wall for steadying and CGA throughout due to unsteadiness (performed in a chair without armrests); 01/04/19: 21.6s one LOB and CGA throughout    Time  12    Period  Weeks    Status  Partially Met    Target Date  02/01/19      PT LONG TERM GOAL #3   Title  Pt will decrease TUG to below 14 seconds/decrease in order to demonstrate decreased fall risk.    Baseline  11/09/2018: 14.22s with a rollator; 01/04/19: 14.0s with a rollator    Time  12    Period  Weeks    Status  Partially Met    Target Date  02/01/19      PT LONG TERM GOAL #4   Title  Pt will decrease DHI score by at least 18 points in order to demonstrate clinically significant reduction in disability    Baseline  11/09/18: 70/100; 01/04/19: 66/100    Time  12    Period  Weeks    Status  Partially Met    Target Date  02/01/19            Plan - 01/23/19 1529    Clinical Impression  Statement  Pt continues to demonstrate good motivation throughout today's session. Continued to work on LE strengthening but standing exercises remain limited due to increase in knee pain. Increased ankle weight resistance for added  challenge to LE strength. Pt is demonstrating improving gait speed when entering/exiting session. She is able to perform balance exercises again today and incorporated the rockerboard into her program. Pt will continue to benefit from skilled PT services to address her deficits in balance to decrease risk for future falls.    Personal Factors and Comorbidities  Age;Behavior Pattern;Comorbidity 3+;Past/Current Experience;Time since onset of injury/illness/exacerbation;Fitness    Comorbidities  hx of CVA, hx of MI, DM, CHF, Anemia, neuropathy, CAD, lymphedema    Examination-Activity Limitations  Lift;Squat;Caring for Others;Locomotion Level;Stairs;Carry;Stand    Examination-Participation Restrictions  Interpersonal Relationship;Cleaning;Community Activity;Driving;Shop    Stability/Clinical Decision Making  Evolving/Moderate complexity    Rehab Potential  Poor    PT Frequency  2x / week    PT Duration  12 weeks    PT Treatment/Interventions  ADLs/Self Care Home Management;Canalith Repostioning;Cryotherapy;Electrical Stimulation;Moist Heat;Traction;Ultrasound;DME Instruction;Gait training;Stair training;Functional mobility training;Therapeutic activities;Therapeutic exercise;Balance training;Neuromuscular re-education;Patient/family education;Manual techniques;Dry needling;Taping;Vestibular;Spinal Manipulations;Joint Manipulations;Energy conservation;Aquatic Therapy;Iontophoresis 20m/ml Dexamethasone    PT Next Visit Plan  progress strength and balance    PT Home Exercise Plan  [email or web address]    Consulted and Agree with Plan of Care  Patient;Family member/caregiver    Family Member Consulted  daughter       Patient will benefit from skilled therapeutic intervention in order to  improve the following deficits and impairments:  Abnormal gait, Decreased balance, Decreased endurance, Decreased mobility, Difficulty walking, Dizziness, Decreased activity tolerance, Decreased coordination, Decreased strength  Visit Diagnosis: Unsteadiness on feet  Muscle weakness (generalized)  Other abnormalities of gait and mobility     Problem List Patient Active Problem List   Diagnosis Date Noted  . Cellulitis of leg, right 08/28/2018  . Diarrhea 08/28/2018  . Opacity of lung on imaging study 08/28/2018  . Anemia of chronic disease 08/28/2018  . Chronic diastolic CHF (congestive heart failure) ([place] 08/28/2018  . Essential hypertension 08/28/2018  . CAD (coronary artery disease) 08/28/2018  . Diabetes mellitus type 2, diet-controlled ([place] 08/28/2018  . CKD (chronic kidney disease) stage 3, GFR 30-59 ml/min 08/28/2018   JPhillips GroutPT, DPT, GCS  Tamaya Pun 01/24/2019, 9:01 AM  [place] [facility] [address] [address]Phone: [phone]  Fax:  [phone] Name: YDelma DroneMRN: [id number] of Birth: [birth year]

## 2019-01-25 ENCOUNTER — Other Ambulatory Visit: Payer: Self-pay

## 2019-01-25 ENCOUNTER — Ambulatory Visit: Payer: Medicare Other

## 2019-01-25 DIAGNOSIS — R2681 Unsteadiness on feet: Secondary | ICD-10-CM | POA: Diagnosis not present

## 2019-01-25 DIAGNOSIS — M6281 Muscle weakness (generalized): Secondary | ICD-10-CM

## 2019-01-25 NOTE — Patient Instructions (Signed)
Access Code: [email or web address]  URL: [email or web address]  Date: 01/25/2019  Prepared by: Roxana Hires   Exercises Seated Hip Abduction with Resistance - 10 reps - 2 sets - 3s hold - 2x daily - 7x weekly Seated Hip Adduction Isometrics with Ball - 10 reps - 2 sets - 3s hold - 2x daily - 7x weekly Seated March with Resistance - 10 reps - 2 sets - 3s hold - 2x daily - 7x weekly Sit to Stand without Arm Support - 5 reps - 2 sets - 2x daily - 7x weekly Heel rises with counter support - 10 reps - 2 sets - 3s hold - 2x daily - 7x weekly Narrow Stance with Counter Support - 3 reps - 30s hold - 2x daily - 7x weekly Romberg Stance on Foam Pad - 3 reps - 30s hold - 2x daily - 7x weekly

## 2019-01-25 NOTE — Therapy (Signed)
[place] MAIN [facility] SERVICES [place], [place], [address] Phone: [phone]   Fax:  [phone]  Physical Therapy Treatment  Patient Details  Name: Gail Page MRN: [id number] Date of Birth: 04-20-1930 Referring Provider (PT): Dr. Carloyn Manner   Encounter Date: 01/25/2019  PT End of Session - 01/25/19 0759    Visit Number  15    Number of Visits  25    Date for PT Re-Evaluation  02/01/19    Authorization Type  eval: 11/09/18, Progress note: 01/04/19    PT Start Time  0800    PT Stop Time  0845    PT Time Calculation (min)  45 min    Equipment Utilized During Treatment  Gait belt    Activity Tolerance  Patient tolerated treatment well    Behavior During Therapy  [facility] for tasks assessed/performed       Past Medical History:  Diagnosis Date  . Anemia   . CHF (congestive heart failure) ([place])   . Coronary artery disease   . Diabetes mellitus without complication ([place])   . Lymphadenopathy   . Neuropathy   . Renal disorder   . Stroke [facility])     Past Surgical History:  Procedure Laterality Date  . CHOLECYSTECTOMY    . TONSILLECTOMY    . TOTAL HIP ARTHROPLASTY      There were no vitals filed for this visit.  Subjective Assessment - 01/25/19 0759    Subjective  Patient reports she is doing well today. She continues to have her chronic L knee pain and it is approximately "3.5/10" upon arrival today. No falls or stumbles since last visit. No specific questions or concerns currently.    Pertinent History  Pt complains of unsteadiness/dizziness that initially began after a CVA in 1981, intensified after a MI in 2017, and that she feels she has become even more unsteady recently.  She decribes it is a lightheaded feeling, stating that she feels it all the time, but it fluctuates.  She states that her symptoms are worse when she hasn't eaten or after Lasix.  She reports that she has had BPPV before, but that she recently was  evaluated by an ENT, stating that she did not have BPPV during her evaluation.  She denies any spinning sensations.  She reports 1 bad fall recently that lead to a hospitalization, and since then, she has completed some HH PT and moved in with her daughter due to safety concerns.    Currently in Pain?  No/denies         TREATMENT   Therapeutic Exercise NuStep L3 x 5 minutes for warm-up during history.  At end of warm-up SpO2: 89% HR: 86, SpO2 recovers to 91% within the first 10s of monitoring with cues for patient to slow her breathing (3 minutes unbilled); TRX squats with chair behind pt to tap 2 x 10, pt reports knee pain but no worse than when she first arrived; Sit to stand without UE support 2 x 5, limited to 5 reps due to increasing difficulty so as not to aggravate L knee; Seated marches with 4# ankle weights (AW) x 20 bilateral; Seated LAQ with 4# AW x 20 bilateral; Seated clams with green tband 3s hold x 25; Seated adductor ball squeeze 3s hold x 25; Seated heel raises with manual resistance 3s hold x 20 bilateral; Seated HS curls with green tband resistance x 20 bilateral; Standing heel/toe raises without UE supportt, 3s hold at  top x 10 each; Updated HEP with patient and provided handout;   Neuromuscular Reeducation: Side stepping without UE support 6' x 6; 6" step taps alternating LE with faded UE support 1-0 x 10 on each side; Airex 6" step taps alternating LE with faded UE support 1-0 x 10 on each side; Airex feet together eyes closed  Airex feet together horizontal and then vertical eyes closed x 30s each;   Pt educated throughout session about proper posture and technique with exercises. Improved exercise technique, movement at target joints, use of target muscles after min to mod verbal, visual, tactile cues.   Ptcontinues todemonstrategood motivation throughout today's session.Continued to work on LE strengthening and initiated TRX squats. Pt is also  able to perform sit to stands without UE support. Updated HEP with patient and provided handout with additional strengthening and balance exercises.Pt willcontinue tobenefit from skilled PT services to address herdeficits in balance todecrease risk for future falls.                        PT Short Term Goals - 01/04/19 0835      PT SHORT TERM GOAL #1   Title  Pt will be independent with HEP in order to improve strength and balance in order to decrease fall risk and improve function at home.    Time  6    Period  Weeks    Status  Achieved    Target Date  12/21/18        PT Long Term Goals - 01/04/19 0835      PT LONG TERM GOAL #1   Title  Pt will improve BERG to >45 in order to decrease her fall risk    Baseline  11/09/18: 29/56; 01/04/19: 40/56    Time  12    Period  Weeks    Status  Partially Met    Target Date  02/01/19      PT LONG TERM GOAL #2   Title  Pt will decrease 5TSTS to <14.8s in order to met normative values for her demographic age range.    Baseline  11/09/18: 29.15s With intermittent use of the wall for steadying and CGA throughout due to unsteadiness (performed in a chair without armrests); 01/04/19: 21.6s one LOB and CGA throughout    Time  12    Period  Weeks    Status  Partially Met    Target Date  02/01/19      PT LONG TERM GOAL #3   Title  Pt will decrease TUG to below 14 seconds/decrease in order to demonstrate decreased fall risk.    Baseline  11/09/2018: 14.22s with a rollator; 01/04/19: 14.0s with a rollator    Time  12    Period  Weeks    Status  Partially Met    Target Date  02/01/19      PT LONG TERM GOAL #4   Title  Pt will decrease DHI score by at least 18 points in order to demonstrate clinically significant reduction in disability    Baseline  11/09/18: 70/100; 01/04/19: 66/100    Time  12    Period  Weeks    Status  Partially Met    Target Date  02/01/19            Plan - 01/25/19 0759    Clinical  Impression Statement  Pt continues to demonstrate good motivation throughout today's session. Continued to work on LE strengthening and  initiated TRX squats. Pt is also able to perform sit to stands without UE support. Updated HEP with patient and provided handout with additional strengthening and balance exercises. Pt will continue to benefit from skilled PT services to address her deficits in balance to decrease risk for future falls.    Personal Factors and Comorbidities  Age;Behavior Pattern;Comorbidity 3+;Past/Current Experience;Time since onset of injury/illness/exacerbation;Fitness    Comorbidities  hx of CVA, hx of MI, DM, CHF, Anemia, neuropathy, CAD, lymphedema    Examination-Activity Limitations  Lift;Squat;Caring for Others;Locomotion Level;Stairs;Carry;Stand    Examination-Participation Restrictions  Interpersonal Relationship;Cleaning;Community Activity;Driving;Shop    Stability/Clinical Decision Making  Evolving/Moderate complexity    Rehab Potential  Poor    PT Frequency  2x / week    PT Duration  12 weeks    PT Treatment/Interventions  ADLs/Self Care Home Management;Canalith Repostioning;Cryotherapy;Electrical Stimulation;Moist Heat;Traction;Ultrasound;DME Instruction;Gait training;Stair training;Functional mobility training;Therapeutic activities;Therapeutic exercise;Balance training;Neuromuscular re-education;Patient/family education;Manual techniques;Dry needling;Taping;Vestibular;Spinal Manipulations;Joint Manipulations;Energy conservation;Aquatic Therapy;Iontophoresis 4m/ml Dexamethasone    PT Next Visit Plan  progress strength and balance    PT Home Exercise Plan  [email or web address]    Consulted and Agree with Plan of Care  Patient;Family member/caregiver    Family Member Consulted  daughter       Patient will benefit from skilled therapeutic intervention in order to improve the following deficits and impairments:  Abnormal gait, Decreased balance, Decreased endurance, Decreased  mobility, Difficulty walking, Dizziness, Decreased activity tolerance, Decreased coordination, Decreased strength  Visit Diagnosis: Unsteadiness on feet  Muscle weakness (generalized)     Problem List Patient Active Problem List   Diagnosis Date Noted  . Cellulitis of leg, right 08/28/2018  . Diarrhea 08/28/2018  . Opacity of lung on imaging study 08/28/2018  . Anemia of chronic disease 08/28/2018  . Chronic diastolic CHF (congestive heart failure) ([place] 08/28/2018  . Essential hypertension 08/28/2018  . CAD (coronary artery disease) 08/28/2018  . Diabetes mellitus type 2, diet-controlled ([place] 08/28/2018  . CKD (chronic kidney disease) stage 3, GFR 30-59 ml/min 08/28/2018   JPhillips GroutPT, DPT, GCS  Sihaam Chrobak 01/25/2019, 9:13 AM  [place]MAIN [facility]ur[facility] SummitSERVICES [address] [place] [address]Phone: [phone]  Fax:  [phone] Name: Gail LasotaMRN: [id number] of Birth: [birth year]

## 2019-02-24 ENCOUNTER — Ambulatory Visit: Payer: Medicare Other | Attending: Otolaryngology

## 2019-02-24 ENCOUNTER — Ambulatory Visit: Payer: Medicare Other

## 2019-02-24 ENCOUNTER — Other Ambulatory Visit: Payer: Self-pay

## 2019-02-24 DIAGNOSIS — M6281 Muscle weakness (generalized): Secondary | ICD-10-CM | POA: Diagnosis present

## 2019-02-24 DIAGNOSIS — R2681 Unsteadiness on feet: Secondary | ICD-10-CM | POA: Diagnosis present

## 2019-02-24 DIAGNOSIS — R42 Dizziness and giddiness: Secondary | ICD-10-CM | POA: Diagnosis present

## 2019-02-24 NOTE — Therapy (Signed)
[place] MAIN [facility] SERVICES [address] [place], [place], [address] Phone: [phone]   Fax:  [phone]  Physical Therapy Treatment  Patient Details  Name: Gail Page MRN: [id number] Date of Birth: 1930-09-16 Referring Provider (PT): Dr. Carloyn Manner   Encounter Date: 02/24/2019  PT End of Session - 02/24/19 1011    Visit Number  16    Number of Visits  26    Date for PT Re-Evaluation  05/19/19    Authorization Type  eval: 11/09/18, Progress note: 01/04/19    PT Start Time  0900    PT Stop Time  0950    PT Time Calculation (min)  50 min    Equipment Utilized During Treatment  Gait belt    Activity Tolerance  Patient tolerated treatment well    Behavior During Therapy  [facility] for tasks assessed/performed       Past Medical History:  Diagnosis Date  . Anemia   . CHF (congestive heart failure) ([place])   . Coronary artery disease   . Diabetes mellitus without complication ([place])   . Lymphadenopathy   . Neuropathy   . Renal disorder   . Stroke [facility])     Past Surgical History:  Procedure Laterality Date  . CHOLECYSTECTOMY    . TONSILLECTOMY    . TOTAL HIP ARTHROPLASTY      There were no vitals filed for this visit.  Subjective Assessment - 02/24/19 0906    Subjective  Pt reports that she is doing well today. She is complaining of chronic 4/10 L knee pain upon arrival. No recent changes in health or medication. No falls or stumbles recently. No specific questions or concerns currently.    Pertinent History  Pt complains of unsteadiness/dizziness that initially began after a CVA in 1981, intensified after a MI in 2017, and that she feels she has become even more unsteady recently.  She decribes it is a lightheaded feeling, stating that she feels it all the time, but it fluctuates.  She states that her symptoms are worse when she hasn't eaten or after Lasix.  She reports that she has had BPPV before, but that she recently was  evaluated by an ENT, stating that she did not have BPPV during her evaluation.  She denies any spinning sensations.  She reports 1 bad fall recently that lead to a hospitalization, and since then, she has completed some HH PT and moved in with her daughter due to safety concerns.    Currently in Pain?  Yes    Pain Score  4     Pain Location  Knee    Pain Orientation  Left    Pain Descriptors / Indicators  Aching    Pain Type  Chronic pain         [facility] PT Assessment - 02/24/19 0918      Berg Balance Test   Sit to Stand  Able to stand without using hands and stabilize independently    Standing Unsupported  Able to stand safely 2 minutes    Sitting with Back Unsupported but Feet Supported on Floor or Stool  Able to sit safely and securely 2 minutes    Stand to Sit  Sits safely with minimal use of hands    Transfers  Able to transfer safely, definite need of hands    Standing Unsupported with Eyes Closed  Able to stand 10 seconds with supervision    Standing Unsupported with Feet Together  Able  to place feet together independently and stand for 1 minute with supervision    From Standing, Reach Forward with Outstretched Arm  Can reach forward >12 cm safely (5")    From Standing Position, Pick up Object from [place] to pick up shoe safely and easily    From Standing Position, Turn to Look Behind Over each Shoulder  Looks behind one side only/other side shows less weight shift    Turn 360 Degrees  Able to turn 360 degrees safely but slowly    Standing Unsupported, Alternately Place Feet on Step/Stool  Able to complete >2 steps/needs minimal assist    Standing Unsupported, One Foot in [place] to take small step independently and hold 30 seconds    Standing on One Leg  Tries to lift leg/unable to hold 3 seconds but remains standing independently    Total Score  41         TREATMENT   Therapeutic Exercise NuStep L1 x 5 minutes for warm-up during history;  Updated outcome  measures and goals with patient (see below)    Results Comments  BERG 41/56 (40/56) Fall risk, in need of intervention  TUG 16.5s (14.0s) Fall risk, slow turns with rollator when returning to chair  5TSTS 24.2s With one LOB and CGA throughout due to unsteadiness   10 Meter Gait Speed Self-selected:13.1 =0.76 m/s; Fastest:11.1s = 0.47mswith rollator (Self-selected:12.2s self =0.82 m/s; Fastest:10.2s =0.98 m/swith rollator) Below normative values for full community ambulation     BPPV TESTS:  Symptoms Duration Intensity Nystagmus  L Dix-Hallpike Vertigo 15s Mild L horizontal (geotrophic) nystagmus  R Dix-Hallpike Vertigo 30s Moderate R horizontal (geotrophic) nystagmus  L Head Roll Vertigo 15s Mild L horizontal (geotrophic) nystagmus  R Head Roll Vertigo 30s Severe R horizontal (geotrophic) nystagmus    CRT Pt treated with 2 bouts of the "BBQ roll" for horizontal canal BPPV. Retesting performed between maneuvers. Treated the R side during first maneuver with 1 minute holds in each position. Pt requires +2 assist from student PT due to difficulty with mobility during rolling. With retesting after the first maneuver with the L roll test pt initially presents with apogeotrophic nystagmus that then converts to vigorous geotrophic nystagmus with severe vertigo. With restesting of R side pt presents with a pure geotrophic nystagmus but short duration and very mild vertigo. Second "BBQ roll" performed for L horizontal canal with 1 minute holds in each position. Retesting performed at end and pt has no nystagmus or vertigo in either L or R roll test positions.    Ptcontinues todemonstrategood motivation throughout today's session.She has not been to therapy for almost a month and outcome measures demonstrate slight decline since they were last updated. Her BERG is roughly the same however her TUG, 5TSTS, and 127mait speed were all slower. She reports a recurrent bout of vertigo so  canal testing performed with patient on this date. Roll test is positive for vertigo and nystagmus of appropriate direction and duration. Pt treated with "BBQ Roll" treatment today and after second bout all testing is negative with no nystagmus or vertigo. Will retest at next session.Pt willcontinue tobenefit from skilled PT services to address herdeficits in balance todecrease risk for future falls.                    PT Short Term Goals - 02/24/19 1018      PT SHORT TERM GOAL #1   Title  Pt will be independent with  HEP in order to improve strength and balance in order to decrease fall risk and improve function at home.    Time  6    Period  Weeks    Status  Achieved    Target Date  12/21/18        PT Long Term Goals - 02/24/19 1018      PT LONG TERM GOAL #1   Title  Pt will improve BERG to >45 in order to decrease her fall risk    Baseline  11/09/18: 29/56; 01/04/19: 40/56; 02/24/19: 41/56    Time  12    Period  Weeks    Status  Partially Met    Target Date  05/19/19      PT LONG TERM GOAL #2   Title  Pt will decrease 5TSTS to <14.8s in order to met normative values for her demographic age range.    Baseline  11/09/18: 29.15s With intermittent use of the wall for steadying and CGA throughout due to unsteadiness (performed in a chair without armrests); 01/04/19: 21.6s one LOB and CGA throughout; 02/24/19: 24.2s one LOB, CGA throughout;    Time  12    Period  Weeks    Status  Partially Met    Target Date  05/19/19      PT LONG TERM GOAL #3   Title  Pt will decrease TUG to below 14 seconds/decrease in order to demonstrate decreased fall risk.    Baseline  11/09/2018: 14.22s with a rollator; 01/04/19: 14.0s with a rollator; 02/24/19: 16.5s with rollator    Time  12    Period  Weeks    Status  Partially Met    Target Date  05/19/19      PT LONG TERM GOAL #4   Title  Pt will decrease DHI score by at least 18 points in order to demonstrate clinically significant  reduction in disability    Baseline  11/09/18: 70/100; 01/04/19: 66/100    Time  12    Period  Weeks    Status  Deferred    Target Date  05/19/19            Plan - 02/24/19 1012    Clinical Impression Statement  Pt continues to demonstrate good motivation throughout today's session. She has not been to therapy for almost a month and outcome measures demonstrate slight decline since they were last updated. Her BERG is roughly the same however her TUG, 5TSTS, and 45mgait speed were all slower. She reports a recurrent bout of vertigo so canal testing performed with patient on this date. Roll test is positive for vertigo and nystagmus of appropriate direction and duration. Pt treated with "BBQ Roll" treatment today and after second bout all testing is negative with no nystagmus or vertigo. Will retest at next session. Pt will continue to benefit from skilled PT services to address her deficits in balance to decrease risk for future falls.    Personal Factors and Comorbidities  Age;Behavior Pattern;Comorbidity 3+;Past/Current Experience;Time since onset of injury/illness/exacerbation;Fitness    Comorbidities  hx of CVA, hx of MI, DM, CHF, Anemia, neuropathy, CAD, lymphedema    Examination-Activity Limitations  Lift;Squat;Caring for Others;Locomotion Level;Stairs;Carry;Stand    Examination-Participation Restrictions  Interpersonal Relationship;Cleaning;Community Activity;Driving;Shop    Stability/Clinical Decision Making  Evolving/Moderate complexity    Rehab Potential  Poor    PT Frequency  2x / week    PT Duration  12 weeks    PT Treatment/Interventions  ADLs/Self Care Home Management;Canalith  Repostioning;Cryotherapy;Electrical Stimulation;Moist Heat;Traction;Ultrasound;DME Instruction;Gait training;Stair training;Functional mobility training;Therapeutic activities;Therapeutic exercise;Balance training;Neuromuscular re-education;Patient/family education;Manual techniques;Dry  needling;Taping;Vestibular;Spinal Manipulations;Joint Manipulations;Energy conservation;Aquatic Therapy;Iontophoresis 23m/ml Dexamethasone    PT Next Visit Plan  Retest roll test and treat as appropriate, reassess HEP, progress strength and balance    PT Home Exercise Plan  [id number]    Consulted and Agree with Plan of Care  Patient;Family member/caregiver    Family Member Consulted  daughter       Patient will benefit from skilled therapeutic intervention in order to improve the following deficits and impairments:  Abnormal gait, Decreased balance, Decreased endurance, Decreased mobility, Difficulty walking, Dizziness, Decreased activity tolerance, Decreased coordination, Decreased strength  Visit Diagnosis: Unsteadiness on feet  Muscle weakness (generalized)  Dizziness and giddiness     Problem List Patient Active Problem List   Diagnosis Date Noted  . Cellulitis of leg, right 08/28/2018  . Diarrhea 08/28/2018  . Opacity of lung on imaging study 08/28/2018  . Anemia of chronic disease 08/28/2018  . Chronic diastolic CHF (congestive heart failure) ([place] 08/28/2018  . Essential hypertension 08/28/2018  . CAD (coronary artery disease) 08/28/2018  . Diabetes mellitus type 2, diet-controlled ([place] 08/28/2018  . CKD (chronic kidney disease) stage 3, GFR 30-59 ml/min 08/28/2018   JPhillips GroutPT, DPT, GCS  Gail Page 02/24/2019, 10:20 AM  [facility] [address] [address]Phone: [phone]  Fax:  [phone] Name: Gail ForgueMRN: [id number] of Birth: 7May 29, 1932

## 2019-03-01 ENCOUNTER — Other Ambulatory Visit: Payer: Self-pay

## 2019-03-01 ENCOUNTER — Ambulatory Visit: Payer: Medicare Other | Attending: Otolaryngology

## 2019-03-01 DIAGNOSIS — R2689 Other abnormalities of gait and mobility: Secondary | ICD-10-CM | POA: Diagnosis present

## 2019-03-01 DIAGNOSIS — R2681 Unsteadiness on feet: Secondary | ICD-10-CM

## 2019-03-01 DIAGNOSIS — R42 Dizziness and giddiness: Secondary | ICD-10-CM | POA: Insufficient documentation

## 2019-03-01 DIAGNOSIS — M6281 Muscle weakness (generalized): Secondary | ICD-10-CM

## 2019-03-01 NOTE — Therapy (Signed)
[place] MAIN [facility] SERVICES [address] [place], [place], [address] Phone: [phone]   Fax:  [phone]  Physical Therapy Treatment  Patient Details  Name: Gail Page MRN: [id number] Date of Birth: [birth year] Referring Provider (PT): Dr. Carloyn Manner   Encounter Date: 03/01/2019  PT End of Session - 03/01/19 0845    Visit Number  17    Number of Visits  59    Date for PT Re-Evaluation  05/19/19    Authorization Type  eval: 11/09/18, Progress note: 01/04/19    PT Start Time  0755    PT Stop Time  0842    PT Time Calculation (min)  47 min    Equipment Utilized During Treatment  Gait belt    Activity Tolerance  Patient tolerated treatment well    Behavior During Therapy  [facility] for tasks assessed/performed       Past Medical History:  Diagnosis Date  . Anemia   . CHF (congestive heart failure) ([place])   . Coronary artery disease   . Diabetes mellitus without complication ([place])   . Lymphadenopathy   . Neuropathy   . Renal disorder   . Stroke [facility])     Past Surgical History:  Procedure Laterality Date  . CHOLECYSTECTOMY    . TONSILLECTOMY    . TOTAL HIP ARTHROPLASTY      There were no vitals filed for this visit.  Subjective Assessment - 03/01/19 0844    Subjective  Pt reports that she is doing well today. She denies any L knee "pain" upon arrival but states that it is just "sore." No recent changes in health or medication. No falls or stumbles recently. She has had a couple more bouts of vertigo since her last therapy session.    Pertinent History  Pt complains of unsteadiness/dizziness that initially began after a CVA in 1981, intensified after a MI in 2017, and that she feels she has become even more unsteady recently.  She decribes it is a lightheaded feeling, stating that she feels it all the time, but it fluctuates.  She states that her symptoms are worse when she hasn't eaten or after Lasix.  She reports that she has  had BPPV before, but that she recently was evaluated by an ENT, stating that she did not have BPPV during her evaluation.  She denies any spinning sensations.  She reports 1 bad fall recently that lead to a hospitalization, and since then, she has completed some HH PT and moved in with her daughter due to safety concerns.    Currently in Pain?  No/denies   Denies pain but complains of L knee "soreness"         TREATMENT   Canalith Repositioning Treatment Performed roll test for horizontal canal BPPV which is negative bilaterally however on the R side she appears to have some R torsional beats with some very mild vertigo. Dix-Hallpike testing performed and is positive bilaterally but slightly worse on the R. Elected to treat R side due to more severe nystagmus and vertigo. Pt treated with 2 bouts of Epley Maneuver for presumed R posterior canal BPPV. One minute holds in each position and retesting between maneuvers as well as after final maneuver. After first maneuver pt continues to present with  upbeating R torsional nystagmus but less severe intensity and pt reports only faint "dizziness" but no vertigo. After second maneuver pt continues to present with positive Dix-Hallpike test on the R but again the nystagmus  is shorter and less vigorous and pt only reports some mild oscillopsia but no vertigo. Repeated L Dix-Hallpike test again at the end and it is also mildly positive. Repeated roll test and it is negative bilaterally.    Therapeutic Exercise NuStep L2x 10 minutes for warm-up during history, (5 minutes unbilled). Vitals monitored intermittently and pt requires one seated rest break with SpO2 dropping to 89%. Seated marches withmanual resistance x 15 bilateral; Seated LAQ withmanual resistance x 15 bilateral; Seated clams with manual resistance x 15 bilateral; Seated adductor isometric squeeze with manual resistance x 15 bilateral; Seated HS curls with red tband resistance x 15  bilateral; Standing bilateral heel raises with BUE handheld support x 15;    Pt educated throughout session about proper posture and technique with exercises. Improved exercise technique, movement at target joints, use of target muscles after min to mod verbal, visual, tactile cues.   Ptcontinues todemonstrategood motivation throughout today's session. She reports a few more bouts of vertigo so canal testing performed again with patient on this date. Roll test is negative bilaterally however Dix-Hallpike test is positive bilaterally with more intense vertigo on the R side. Pt treated with 2 bouts of Epley Maneuver for presumed R posterior canal BPPV. One minute holds in each position and retesting between maneuvers as well as after final maneuver. After first maneuver pt continues to present with  upbeating R torsional nystagmus but less severe intensity and pt reports only faint "dizziness" but no vertigo. After second maneuver pt continues to present with positive Dix-Hallpike test on the R but again the nystagmus is shorter and less vigorous and pt only reports some mild oscillopsia but no vertigo. Repeated L Dix-Hallpike test again at the end of session and it is still mildly positive. Repeated roll test and it is negative bilaterally. After CRT continued to work on LE strengthening. Pt willcontinue tobenefit from skilled PT services to address herdeficits in balance todecrease risk for future falls.                        PT Short Term Goals - 02/24/19 1018      PT SHORT TERM GOAL #1   Title  Pt will be independent with HEP in order to improve strength and balance in order to decrease fall risk and improve function at home.    Time  6    Period  Weeks    Status  Achieved    Target Date  12/21/18        PT Long Term Goals - 02/24/19 1018      PT LONG TERM GOAL #1   Title  Pt will improve BERG to >45 in order to decrease her fall risk    Baseline   11/09/18: 29/56; 01/04/19: 40/56; 02/24/19: 41/56    Time  12    Period  Weeks    Status  Partially Met    Target Date  05/19/19      PT LONG TERM GOAL #2   Title  Pt will decrease 5TSTS to <14.8s in order to met normative values for her demographic age range.    Baseline  11/09/18: 29.15s With intermittent use of the wall for steadying and CGA throughout due to unsteadiness (performed in a chair without armrests); 01/04/19: 21.6s one LOB and CGA throughout; 02/24/19: 24.2s one LOB, CGA throughout;    Time  12    Period  Weeks    Status  Partially Met  Target Date  05/19/19      PT LONG TERM GOAL #3   Title  Pt will decrease TUG to below 14 seconds/decrease in order to demonstrate decreased fall risk.    Baseline  11/09/2018: 14.22s with a rollator; 01/04/19: 14.0s with a rollator; 02/24/19: 16.5s with rollator    Time  12    Period  Weeks    Status  Partially Met    Target Date  05/19/19      PT LONG TERM GOAL #4   Title  Pt will decrease DHI score by at least 18 points in order to demonstrate clinically significant reduction in disability    Baseline  11/09/18: 70/100; 01/04/19: 66/100    Time  12    Period  Weeks    Status  Deferred    Target Date  05/19/19            Plan - 03/01/19 0846    Clinical Impression Statement  Pt continues to demonstrate good motivation throughout today's session. She reports a few more bouts of vertigo so canal testing performed again with patient on this date. Roll test is negative bilaterally however Dix-Hallpike test is positive bilaterally with more intense vertigo on the R side. Pt treated with 2 bouts of Epley Maneuver for presumed R posterior canal BPPV. One minute holds in each position and retesting between maneuvers as well as after final maneuver. After first maneuver pt continues to present with  upbeating R torsional nystagmus but less severe intensity and pt reports only faint "dizziness" but no vertigo. After second maneuver pt  continues to present with positive Dix-Hallpike test on the R but again the nystagmus is shorter and less vigorous and pt only reports some mild oscillopsia but no vertigo. Repeated L Dix-Hallpike test again at the end of session and it is still mildly positive. Repeated roll test and it is negative bilaterally. After CRT continued to work on LE strengthening. Pt will continue to benefit from skilled PT services to address her deficits in balance to decrease risk for future falls.    Personal Factors and Comorbidities  Age;Behavior Pattern;Comorbidity 3+;Past/Current Experience;Time since onset of injury/illness/exacerbation;Fitness    Comorbidities  hx of CVA, hx of MI, DM, CHF, Anemia, neuropathy, CAD, lymphedema    Examination-Activity Limitations  Lift;Squat;Caring for Others;Locomotion Level;Stairs;Carry;Stand    Examination-Participation Restrictions  Interpersonal Relationship;Cleaning;Community Activity;Driving;Shop    Stability/Clinical Decision Making  Evolving/Moderate complexity    Rehab Potential  Poor    PT Frequency  2x / week    PT Duration  12 weeks    PT Treatment/Interventions  ADLs/Self Care Home Management;Canalith Repostioning;Cryotherapy;Electrical Stimulation;Moist Heat;Traction;Ultrasound;DME Instruction;Gait training;Stair training;Functional mobility training;Therapeutic activities;Therapeutic exercise;Balance training;Neuromuscular re-education;Patient/family education;Manual techniques;Dry needling;Taping;Vestibular;Spinal Manipulations;Joint Manipulations;Energy conservation;Aquatic Therapy;Iontophoresis '4mg'$ /ml Dexamethasone    PT Next Visit Plan  Retest for BPPV and treat as appropriate, reassess HEP, progress strength and balance    PT Home Exercise Plan  [email or web address]    Consulted and Agree with Plan of Care  Patient;Family member/caregiver    Family Member Consulted  daughter       Patient will benefit from skilled therapeutic intervention in order to improve the  following deficits and impairments:  Abnormal gait, Decreased balance, Decreased endurance, Decreased mobility, Difficulty walking, Dizziness, Decreased activity tolerance, Decreased coordination, Decreased strength  Visit Diagnosis: Unsteadiness on feet  Muscle weakness (generalized)     Problem List Patient Active Problem List   Diagnosis Date Noted  . Cellulitis of leg, right 08/28/2018  . Diarrhea  08/28/2018  . Opacity of lung on imaging study 08/28/2018  . Anemia of chronic disease 08/28/2018  . Chronic diastolic CHF (congestive heart failure) ([place]) 08/28/2018  . Essential hypertension 08/28/2018  . CAD (coronary artery disease) 08/28/2018  . Diabetes mellitus type 2, diet-controlled ([place]) 08/28/2018  . CKD (chronic kidney disease) stage 3, GFR 30-59 ml/min 08/28/2018   Phillips Grout PT, DPT, GCS  Enzley Kitchens 03/01/2019, 10:01 AM  [place] MAIN [facility] SERVICES [address] [place], [place], [address] Phone: [phone]   Fax:  [phone]  Name: Shaylan Tutton MRN: [id number] Date of Birth: Jun 24, 1930

## 2019-03-06 ENCOUNTER — Ambulatory Visit: Payer: Medicare Other

## 2019-03-06 ENCOUNTER — Other Ambulatory Visit: Payer: Self-pay

## 2019-03-06 DIAGNOSIS — R42 Dizziness and giddiness: Secondary | ICD-10-CM

## 2019-03-06 DIAGNOSIS — R2681 Unsteadiness on feet: Secondary | ICD-10-CM

## 2019-03-06 DIAGNOSIS — M6281 Muscle weakness (generalized): Secondary | ICD-10-CM

## 2019-03-06 NOTE — Therapy (Signed)
[place] MAIN [facility] SERVICES [address] [place], [place], [address] Phone: [phone]   Fax:  [phone]  Physical Therapy Treatment  Patient Details  Name: Gail Page MRN: [id number] Date of Birth: November 02, 1930 Referring Provider (PT): Dr. Carloyn Manner   Encounter Date: 03/06/2019  PT End of Session - 03/06/19 0851    Visit Number  18    Number of Visits  38    Date for PT Re-Evaluation  05/19/19    Authorization Type  eval: 11/09/18, Progress note: 01/04/19    PT Start Time  0847    PT Stop Time  0930    PT Time Calculation (min)  43 min    Equipment Utilized During Treatment  Gait belt    Activity Tolerance  Patient tolerated treatment well    Behavior During Therapy  [facility] for tasks assessed/performed       Past Medical History:  Diagnosis Date  . Anemia   . CHF (congestive heart failure) ([place])   . Coronary artery disease   . Diabetes mellitus without complication ([place])   . Lymphadenopathy   . Neuropathy   . Renal disorder   . Stroke [facility])     Past Surgical History:  Procedure Laterality Date  . CHOLECYSTECTOMY    . TONSILLECTOMY    . TOTAL HIP ARTHROPLASTY      There were no vitals filed for this visit.  Subjective Assessment - 03/06/19 0851    Subjective  Pt reports that she is doing well today. She denies any L knee "pain" upon arrival but states that it is just "sore." No recent changes in health or medication. No falls or stumbles recently. She has had a couple more bouts of vertigo since her last therapy session.    Pertinent History  Pt complains of unsteadiness/dizziness that initially began after a CVA in 1981, intensified after a MI in 2017, and that she feels she has become even more unsteady recently.  She decribes it is a lightheaded feeling, stating that she feels it all the time, but it fluctuates.  She states that her symptoms are worse when she hasn't eaten or after Lasix.  She reports that she has  had BPPV before, but that she recently was evaluated by an ENT, stating that she did not have BPPV during her evaluation.  She denies any spinning sensations.  She reports 1 bad fall recently that lead to a hospitalization, and since then, she has completed some HH PT and moved in with her daughter due to safety concerns.    Currently in Pain?  No/denies         TREATMENT   Canalith Repositioning Treatment Dix-Hallpike testing performed and is positive bilaterally with upbeating torsional nystagmus of appropriate direction. On the L side once the upbeating L torsional nystagmus ends it converted to downbeating. On the R it converted to pure R horizontal. Duration is approximately the same on both sides and pt reports "1.5/10" vertigo bilaterally. Elected to treat R side due to having treated during last session. Pt treated with 2 bouts of Epley Maneuver for presumed R posterior canal BPPV. Two minute holds in each position and retesting between maneuvers as well as after final maneuver. After first maneuver pt only has pure downbeating nystagmus on the R side but less severe intensity and pt reports only faint "dizziness" but no vertigo. After second maneuver pt continues to present with a few faint downbeats but no vertigo.   Therapeutic  Exercise NuStep L2-3 x 5 minutes for warm-up during history, 4 minutes unbilled; Vitals monitored intermittently and pt requires one seated rest break due to DOE however SpO2 remains >90% throughout; Seated marches withmanual resistance x 15 bilateral; Seated LAQ withmanual resistance x 15 bilateral; Seated clams with manual resistance x 15 bilateral; Seated adductor isometric squeeze with manual resistance x 15 bilateral; Seated HS curls with red tband resistance x 15 bilateral; Standing bilateral heel raises with BUE handheld support x 15;    Pt educated throughout session about proper posture and technique with exercises. Improved exercise  technique, movement at target joints, use of target muscles after min to mod verbal, visual, tactile cues.   Ptcontinues todemonstrategood motivation throughout today's session. Dix-Hallpike testing performed again today and is positive bilaterally with upbeating torsional nystagmus of appropriate direction on each side. On the L side once the upbeating L torsional nystagmus ends it converted to downbeating. On the R it converted to pure R horizontal. Duration is approximately the same on both sides and pt reports "1.5/10" vertigo bilaterally. Elected to treat R side due to having treated during last session. Pt treated with 2 bouts of Epley Maneuver for presumed R posterior canal BPPV. Two minute holds in each position and retesting between maneuvers as well as after final maneuver. After first maneuver pt only has pure downbeating nystagmus on the R side but less severe intensity and pt reports only faint "dizziness" but no vertigo. After second maneuver pt continues to present with a few faint downbeats but no vertigo. After canal repositioning treatment progressed to some limited exercises. Will repeat canal testing at next session and treat as appropriate. Pt will benefit from PT services to address deficits in strength, balance, and mobility in order to return to full function at home.                        PT Short Term Goals - 02/24/19 1018      PT SHORT TERM GOAL #1   Title  Pt will be independent with HEP in order to improve strength and balance in order to decrease fall risk and improve function at home.    Time  6    Period  Weeks    Status  Achieved    Target Date  12/21/18        PT Long Term Goals - 02/24/19 1018      PT LONG TERM GOAL #1   Title  Pt will improve BERG to >45 in order to decrease her fall risk    Baseline  11/09/18: 29/56; 01/04/19: 40/56; 02/24/19: 41/56    Time  12    Period  Weeks    Status  Partially Met    Target Date  05/19/19       PT LONG TERM GOAL #2   Title  Pt will decrease 5TSTS to <14.8s in order to met normative values for her demographic age range.    Baseline  11/09/18: 29.15s With intermittent use of the wall for steadying and CGA throughout due to unsteadiness (performed in a chair without armrests); 01/04/19: 21.6s one LOB and CGA throughout; 02/24/19: 24.2s one LOB, CGA throughout;    Time  12    Period  Weeks    Status  Partially Met    Target Date  05/19/19      PT LONG TERM GOAL #3   Title  Pt will decrease TUG to below 14 seconds/decrease in  order to demonstrate decreased fall risk.    Baseline  11/09/2018: 14.22s with a rollator; 01/04/19: 14.0s with a rollator; 02/24/19: 16.5s with rollator    Time  12    Period  Weeks    Status  Partially Met    Target Date  05/19/19      PT LONG TERM GOAL #4   Title  Pt will decrease DHI score by at least 18 points in order to demonstrate clinically significant reduction in disability    Baseline  11/09/18: 70/100; 01/04/19: 66/100    Time  12    Period  Weeks    Status  Deferred    Target Date  05/19/19            Plan - 03/06/19 0851    Clinical Impression Statement  Pt continues to demonstrate good motivation throughout today's session. Dix-Hallpike testing performed again today and is positive bilaterally with upbeating torsional nystagmus of appropriate direction on each side. On the L side once the upbeating L torsional nystagmus ends it converted to downbeating. On the R it converted to pure R horizontal. Duration is approximately the same on both sides and pt reports "1.5/10" vertigo bilaterally. Elected to treat R side due to having treated during last session. Pt treated with 2 bouts of Epley Maneuver for presumed R posterior canal BPPV. Two minute holds in each position and retesting between maneuvers as well as after final maneuver. After first maneuver pt only has pure downbeating nystagmus on the R side but less severe intensity and pt reports  only faint "dizziness" but no vertigo. After second maneuver pt continues to present with a few faint downbeats but no vertigo. After canal repositioning treatment progressed to some limited exercises. Will repeat canal testing at next session and treat as appropriate. Pt will benefit from PT services to address deficits in strength, balance, and mobility in order to return to full function at home.    Personal Factors and Comorbidities  Age;Behavior Pattern;Comorbidity 3+;Past/Current Experience;Time since onset of injury/illness/exacerbation;Fitness    Comorbidities  hx of CVA, hx of MI, DM, CHF, Anemia, neuropathy, CAD, lymphedema    Examination-Activity Limitations  Lift;Squat;Caring for Others;Locomotion Level;Stairs;Carry;Stand    Examination-Participation Restrictions  Interpersonal Relationship;Cleaning;Community Activity;Driving;Shop    Stability/Clinical Decision Making  Evolving/Moderate complexity    Rehab Potential  Poor    PT Frequency  2x / week    PT Duration  12 weeks    PT Treatment/Interventions  ADLs/Self Care Home Management;Canalith Repostioning;Cryotherapy;Electrical Stimulation;Moist Heat;Traction;Ultrasound;DME Instruction;Gait training;Stair training;Functional mobility training;Therapeutic activities;Therapeutic exercise;Balance training;Neuromuscular re-education;Patient/family education;Manual techniques;Dry needling;Taping;Vestibular;Spinal Manipulations;Joint Manipulations;Energy conservation;Aquatic Therapy;Iontophoresis 53m/ml Dexamethasone    PT Next Visit Plan  Retest for BPPV and treat as appropriate, reassess HEP, progress strength and balance    PT Home Exercise Plan  [id number]    Consulted and Agree with Plan of Care  Patient;Family member/caregiver    Family Member Consulted  daughter       Patient will benefit from skilled therapeutic intervention in order to improve the following deficits and impairments:  Abnormal gait, Decreased balance, Decreased  endurance, Decreased mobility, Difficulty walking, Dizziness, Decreased activity tolerance, Decreased coordination, Decreased strength  Visit Diagnosis: Unsteadiness on feet  Muscle weakness (generalized)  Dizziness and giddiness     Problem List Patient Active Problem List   Diagnosis Date Noted  . Cellulitis of leg, right 08/28/2018  . Diarrhea 08/28/2018  . Opacity of lung on imaging study 08/28/2018  . Anemia of chronic disease 08/28/2018  .  Chronic diastolic CHF (congestive heart failure) ([place]) 08/28/2018  . Essential hypertension 08/28/2018  . CAD (coronary artery disease) 08/28/2018  . Diabetes mellitus type 2, diet-controlled ([place]) 08/28/2018  . CKD (chronic kidney disease) stage 3, GFR 30-59 ml/min 08/28/2018   Phillips Grout PT, DPT, GCS  Gavin Faivre 03/06/2019, 10:02 AM  [place] MAIN [facility] SERVICES [address] [place], [place], [address] Phone: [phone]   Fax:  [phone]  Name: Gail Page MRN: [id number] Date of Birth: 1930/09/18

## 2019-03-08 ENCOUNTER — Ambulatory Visit: Payer: Medicare Other

## 2019-03-08 ENCOUNTER — Other Ambulatory Visit: Payer: Self-pay

## 2019-03-08 DIAGNOSIS — R2681 Unsteadiness on feet: Secondary | ICD-10-CM | POA: Diagnosis not present

## 2019-03-08 DIAGNOSIS — R42 Dizziness and giddiness: Secondary | ICD-10-CM

## 2019-03-08 DIAGNOSIS — M6281 Muscle weakness (generalized): Secondary | ICD-10-CM

## 2019-03-08 NOTE — Therapy (Signed)
[place] MAIN [facility] SERVICES [place], [place], [address] Phone: [phone]   Fax:  [phone]  Physical Therapy Treatment  Patient Details  Name: Gail Page MRN: [id number] Date of Birth: Jan 24, 1931 Referring Provider (PT): Dr. Carloyn Manner   Encounter Date: 03/08/2019  PT End of Session - 03/08/19 0807    Visit Number  19    Number of Visits  79    Date for PT Re-Evaluation  05/19/19    Authorization Type  eval: 11/09/18, Progress note: 01/04/19    PT Start Time  0802    PT Stop Time  0845    PT Time Calculation (min)  43 min    Equipment Utilized During Treatment  Gait belt    Activity Tolerance  Patient tolerated treatment well    Behavior During Therapy  [facility] for tasks assessed/performed       Past Medical History:  Diagnosis Date  . Anemia   . CHF (congestive heart failure) ([place])   . Coronary artery disease   . Diabetes mellitus without complication ([place])   . Lymphadenopathy   . Neuropathy   . Renal disorder   . Stroke [facility])     Past Surgical History:  Procedure Laterality Date  . CHOLECYSTECTOMY    . TONSILLECTOMY    . TOTAL HIP ARTHROPLASTY      There were no vitals filed for this visit.  Subjective Assessment - 03/08/19 0807    Subjective  Pt reports that she is doing well today. No recent changes in health or medication. No falls or stumbles recently. She has not had any more bouts of vertigo since the last therapy session.    Pertinent History  Pt complains of unsteadiness/dizziness that initially began after a CVA in 1981, intensified after a MI in 2017, and that she feels she has become even more unsteady recently.  She decribes it is a lightheaded feeling, stating that she feels it all the time, but it fluctuates.  She states that her symptoms are worse when she hasn't eaten or after Lasix.  She reports that she has had BPPV before, but that she recently was evaluated by an ENT, stating that  she did not have BPPV during her evaluation.  She denies any spinning sensations.  She reports 1 bad fall recently that lead to a hospitalization, and since then, she has completed some HH PT and moved in with her daughter due to safety concerns.    Currently in Pain?  No/denies          TREATMENT   CanalithRepositioningTreatment Dix-Hallpike testing performed and is positive bilaterally with upbeating torsional nystagmus of appropriate direction. On the L side once the upbeating L torsional nystagmus ends it converts to downbeating. No vertigo reported. On the R only upbeating R torsional but duration is shorter than last session and pt only reports a very faint vertigo. Elected to treat R side again due to having treated during last session. Pt treated with 1 bout of Epley Maneuver for presumedRposterior canal BPPV. [place] holds in each position. She requests only one treatment be performed due to feeling dizzy afterwards.    Therapeutic Exercise NuStep L2-3 x 40mnutes for warm-up during history, 4 minutes unbilled; Vitals monitored intermittently and pt requires one seated rest break due to DOE however SpO2 remains >90% throughout; Seated marches withmanual resistance x 15 bilateral; Seated LAQ withmanual resistance x 15 RLE, x 5 LLE but dc due to L knee pain;  Seated clams withmanual resistance x 15 bilateral; Seated adductorisometric squeeze with manual resistance x 15 bilateral; Seated HS curls withredtband resistance x 15bilateral; Standingbilateralheel raises with BUE handheld support x 15; Sit to stand with BUE handheld support x 10;   Neuromuscular Re-education  NBOS eyes open/closed x 30s each; Semitandem alternating forward LE eyes open x 30s; Semitandem alternating forward LE horizontal and vertical head turns x 30s each; Airex NBOS eyes open/closed x 30s each;   Pt educated throughout session about proper posture and technique with exercises.  Improved exercise technique, movement at target joints, use of target muscles after min to mod verbal, visual, tactile cues.   Ptcontinues todemonstrategood motivation throughout today's session. Dix-Hallpike testing performed again today and is positive bilaterally with upbeating torsional nystagmus of appropriate direction. On the L side once the upbeating L torsional nystagmus ends it converts to downbeating. No vertigo reported. On the R only upbeating R torsional but duration is shorter than last session and pt only reports a very faint vertigo. Elected to treat R side again due to having treated during last session. Pt treated with 1 bout of Epley Maneuver for presumedRposterior canal BPPV. [place] holds in each position. She requests only one treatment be performed due to feeling dizzy afterwards. After canal repositioning treatment progressed to some limited exercises for strength and balance. Will repeat canal testing at next session and treat as appropriate likely with just one round of canal repositioning each session. Pt will need a progress note at next visit. Pt will benefit from PT services to address deficits in strength, balance, and mobility in order to return to full function at home.                         PT Short Term Goals - 02/24/19 1018      PT SHORT TERM GOAL #1   Title  Pt will be independent with HEP in order to improve strength and balance in order to decrease fall risk and improve function at home.    Time  6    Period  Weeks    Status  Achieved    Target Date  12/21/18        PT Long Term Goals - 02/24/19 1018      PT LONG TERM GOAL #1   Title  Pt will improve BERG to >45 in order to decrease her fall risk    Baseline  11/09/18: 29/56; 01/04/19: 40/56; 02/24/19: 41/56    Time  12    Period  Weeks    Status  Partially Met    Target Date  05/19/19      PT LONG TERM GOAL #2   Title  Pt will decrease 5TSTS to <14.8s in order to  met normative values for her demographic age range.    Baseline  11/09/18: 29.15s With intermittent use of the wall for steadying and CGA throughout due to unsteadiness (performed in a chair without armrests); 01/04/19: 21.6s one LOB and CGA throughout; 02/24/19: 24.2s one LOB, CGA throughout;    Time  12    Period  Weeks    Status  Partially Met    Target Date  05/19/19      PT LONG TERM GOAL #3   Title  Pt will decrease TUG to below 14 seconds/decrease in order to demonstrate decreased fall risk.    Baseline  11/09/2018: 14.22s with a rollator; 01/04/19: 14.0s with a rollator; 02/24/19: 16.5s with  rollator    Time  12    Period  Weeks    Status  Partially Met    Target Date  05/19/19      PT LONG TERM GOAL #4   Title  Pt will decrease DHI score by at least 18 points in order to demonstrate clinically significant reduction in disability    Baseline  11/09/18: 70/100; 01/04/19: 66/100    Time  12    Period  Weeks    Status  Deferred    Target Date  05/19/19            Plan - 03/08/19 0808    Clinical Impression Statement  Pt continues to demonstrate good motivation throughout today's session. Dix-Hallpike testing performed again today and is positive bilaterally with upbeating torsional nystagmus of appropriate direction. On the L side once the upbeating L torsional nystagmus ends it converts to downbeating. No vertigo reported. On the R only upbeating R torsional but duration is shorter than last session and pt only reports a very faint vertigo. Elected to treat R side again due to having treated during last session. Pt treated with 1 bout of Epley Maneuver for presumed R posterior canal BPPV. Two minute holds in each position. She requests only one treatment be performed due to feeling dizzy afterwards. After canal repositioning treatment progressed to some limited exercises for strength and balance. Will repeat canal testing at next session and treat as appropriate likely with just one  round of canal repositioning each session. Pt will need a progress note at next visit. Pt will benefit from PT services to address deficits in strength, balance, and mobility in order to return to full function at home.    Personal Factors and Comorbidities  Age;Behavior Pattern;Comorbidity 3+;Past/Current Experience;Time since onset of injury/illness/exacerbation;Fitness    Comorbidities  hx of CVA, hx of MI, DM, CHF, Anemia, neuropathy, CAD, lymphedema    Examination-Activity Limitations  Lift;Squat;Caring for Others;Locomotion Level;Stairs;Carry;Stand    Examination-Participation Restrictions  Interpersonal Relationship;Cleaning;Community Activity;Driving;Shop    Stability/Clinical Decision Making  Evolving/Moderate complexity    Rehab Potential  Poor    PT Frequency  2x / week    PT Duration  12 weeks    PT Treatment/Interventions  ADLs/Self Care Home Management;Canalith Repostioning;Cryotherapy;Electrical Stimulation;Moist Heat;Traction;Ultrasound;DME Instruction;Gait training;Stair training;Functional mobility training;Therapeutic activities;Therapeutic exercise;Balance training;Neuromuscular re-education;Patient/family education;Manual techniques;Dry needling;Taping;Vestibular;Spinal Manipulations;Joint Manipulations;Energy conservation;Aquatic Therapy;Iontophoresis 58m/ml Dexamethasone    PT Next Visit Plan  Progress note (goals updated 02/24/19); Retest for BPPV and treat as appropriate, reassess HEP, progress strength and balance    PT Home Exercise Plan  [email or web address]    Consulted and Agree with Plan of Care  Patient;Family member/caregiver    Family Member Consulted  daughter       Patient will benefit from skilled therapeutic intervention in order to improve the following deficits and impairments:  Abnormal gait, Decreased balance, Decreased endurance, Decreased mobility, Difficulty walking, Dizziness, Decreased activity tolerance, Decreased coordination, Decreased strength  Visit  Diagnosis: Dizziness and giddiness  Unsteadiness on feet  Muscle weakness (generalized)     Problem List Patient Active Problem List   Diagnosis Date Noted  . Cellulitis of leg, right 08/28/2018  . Diarrhea 08/28/2018  . Opacity of lung on imaging study 08/28/2018  . Anemia of chronic disease 08/28/2018  . Chronic diastolic CHF (congestive heart failure) ([place] 08/28/2018  . Essential hypertension 08/28/2018  . CAD (coronary artery disease) 08/28/2018  . Diabetes mellitus type 2, diet-controlled ([place] 08/28/2018  . CKD (chronic kidney disease)  stage 3, GFR 30-59 ml/min 08/28/2018   Phillips Grout PT, DPT, GCS  M[place]ow Abramo 03/08/2019, 9:25 AM  [place] MAIN [facility] SERVICES [address] [place], [place], [address] Phone: [phone]   Fax:  [phone]  Name: Gail Page MRN: [id number] Date of Birth: 1930/04/06

## 2019-03-13 ENCOUNTER — Ambulatory Visit: Payer: Medicare Other

## 2019-03-13 ENCOUNTER — Other Ambulatory Visit: Payer: Self-pay

## 2019-03-13 DIAGNOSIS — R2689 Other abnormalities of gait and mobility: Secondary | ICD-10-CM

## 2019-03-13 DIAGNOSIS — R2681 Unsteadiness on feet: Secondary | ICD-10-CM

## 2019-03-13 DIAGNOSIS — R42 Dizziness and giddiness: Secondary | ICD-10-CM

## 2019-03-13 DIAGNOSIS — M6281 Muscle weakness (generalized): Secondary | ICD-10-CM
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
## 2019-03-15 ENCOUNTER — Ambulatory Visit: Payer: Medicare Other

## 2019-03-15 ENCOUNTER — Other Ambulatory Visit: Payer: Self-pay

## 2019-03-15 DIAGNOSIS — R42 Dizziness and giddiness: Secondary | ICD-10-CM

## 2019-03-15 DIAGNOSIS — M6281 Muscle weakness (generalized): Secondary | ICD-10-CM

## 2019-03-15 DIAGNOSIS — R2681 Unsteadiness on feet: Secondary | ICD-10-CM | POA: Diagnosis not present

## 2019-03-15 DIAGNOSIS — R2689 Other abnormalities of gait and mobility: Secondary | ICD-10-CM

## 2019-03-15 NOTE — Therapy (Signed)
[place] MAIN [facility] SERVICES [address] [place], [place], [address] Phone: [phone]   Fax:  [phone]  Physical Therapy Treatment  Patient Details  Name: Gail Page MRN: [id number] Date of Birth: July 14, 1930 Referring Provider (PT): Dr. Carloyn Manner   Encounter Date: 03/15/2019  PT End of Session - 03/15/19 0807    Visit Number  21    Number of Visits  37    Date for PT Re-Evaluation  05/19/19    Authorization Type  eval: 11/09/18, Progress note: 01/04/19    PT Start Time  0801    PT Stop Time  0846    PT Time Calculation (min)  45 min    Equipment Utilized During Treatment  Gait belt    Activity Tolerance  Patient tolerated treatment well;No increased pain    Behavior During Therapy  WFL for tasks assessed/performed       Past Medical History:  Diagnosis Date  . Anemia   . CHF (congestive heart failure) ([place])   . Coronary artery disease   . Diabetes mellitus without complication ([place])   . Lymphadenopathy   . Neuropathy   . Renal disorder   . Stroke [facility])     Past Surgical History:  Procedure Laterality Date  . CHOLECYSTECTOMY    . TONSILLECTOMY    . TOTAL HIP ARTHROPLASTY      There were no vitals filed for this visit.  Subjective Assessment - 03/15/19 0806    Subjective  Pt doing well this date. Reports some post exercise soreness yesterday following her last PT visit, but mostly resolved this morning.    Patient is accompained by:  Family member    Pertinent History  Pt complains of unsteadiness/dizziness that initially began after a CVA in 1981, intensified after a MI in 2017, and that she feels she has become even more unsteady recently.  She decribes it is a lightheaded feeling, stating that she feels it all the time, but it fluctuates.  She states that her symptoms are worse when she hasn't eaten or after Lasix.  She reports that she has had BPPV before, but that she recently was evaluated by an ENT,  stating that she did not have BPPV during her evaluation.  She denies any spinning sensations.  She reports 1 bad fall recently that lead to a hospitalization, and since then, she has completed some HH PT and moved in with her daughter due to safety concerns.    Limitations  Standing;Walking;House hold activities;Lifting    Currently in Pain?  Yes    Pain Score  4     Pain Location  Knee   chronic left knee OA pain      INTERVENTION THIS DATE:  Therapeutic Exercise -Nustep AA/ROM BLE, Seat 8, arms 9, Level 2 x5 minutes, >90spm  -standing marching, reciprocal pattern 1x20 c 1lbAW -standing bilat heel raises 1x20  -sit to stand with BUE handheld support x 10; -seated adductor isometric squeeze with rainbow ball 15x3secH; -seated clams with greenTB 1x15 biolat, ball between feet -standing HS curls with 1lb AW 1x10 bilat -seated LAQ with manual resistance x15 RLE c 3lbAW; -seated LLE SAQ 1x15 (heel on stool)  -seated ankle DFlexion, 3lb AW around shod forefoot, heel resting on stool, starting in end-range PFlexion, 2x15  *adequate breaks offered d/t DOE after most resistance activities   Neuromuscular Re-education  NBOS eyes closed 3x30s, minguardA Semitandem alternating forward LE eyes open 2x30s each;  Airex normal stance horizontal  and vertical head turns 12x each (36x total), minimal sway noted, no LOB; Airex normal stance self ball toss (2000g ball toss)       PT Short Term Goals - 02/24/19 1018      PT SHORT TERM GOAL #1   Title  Pt will be independent with HEP in order to improve strength and balance in order to decrease fall risk and improve function at home.    Time  6    Period  Weeks    Status  Achieved    Target Date  12/21/18        PT Long Term Goals - 02/24/19 1018      PT LONG TERM GOAL #1   Title  Pt will improve BERG to >45 in order to decrease her fall risk    Baseline  11/09/18: 29/56; 01/04/19: 40/56; 02/24/19: 41/56    Time  12    Period  Weeks     Status  Partially Met    Target Date  05/19/19      PT LONG TERM GOAL #2   Title  Pt will decrease 5TSTS to <14.8s in order to met normative values for her demographic age range.    Baseline  11/09/18: 29.15s With intermittent use of the wall for steadying and CGA throughout due to unsteadiness (performed in a chair without armrests); 01/04/19: 21.6s one LOB and CGA throughout; 02/24/19: 24.2s one LOB, CGA throughout;    Time  12    Period  Weeks    Status  Partially Met    Target Date  05/19/19      PT LONG TERM GOAL #3   Title  Pt will decrease TUG to below 14 seconds/decrease in order to demonstrate decreased fall risk.    Baseline  11/09/2018: 14.22s with a rollator; 01/04/19: 14.0s with a rollator; 02/24/19: 16.5s with rollator    Time  12    Period  Weeks    Status  Partially Met    Target Date  05/19/19      PT LONG TERM GOAL #4   Title  Pt will decrease DHI score by at least 18 points in order to demonstrate clinically significant reduction in disability    Baseline  11/09/18: 70/100; 01/04/19: 66/100    Time  12    Period  Weeks    Status  Deferred    Target Date  05/19/19            Plan - 03/15/19 0807    Clinical Impression Statement  Pt able to complete entire session as planned with rest breaks provided as needed. Pt maintains high level of focus and motivation. Extensive verbal, visual, and tactile cues are provided for most accurate form possible. Progressed marching and hamstrings curls to standing to promote more weight bearing activity. Pt continues to tolerate SAQ on LLE fairly well, and LAQ on RLE are increased to 3lbAW. Included some anterior compartment strengthening to improve sagittal plane ankle strategy for trunk righting responses. Overall pt continues to make steady progress toward treatment goals.    Rehab Potential  Poor    PT Frequency  2x / week    PT Duration  12 weeks    PT Treatment/Interventions  ADLs/Self Care Home Management;Canalith  Repostioning;Cryotherapy;Electrical Stimulation;Moist Heat;Traction;Ultrasound;DME Instruction;Gait training;Stair training;Functional mobility training;Therapeutic activities;Therapeutic exercise;Balance training;Neuromuscular re-education;Patient/family education;Manual techniques;Dry needling;Taping;Vestibular;Spinal Manipulations;Joint Manipulations;Energy conservation;Aquatic Therapy;Iontophoresis 68m/ml Dexamethasone    PT Next Visit Plan  Progress note (goals updated 02/24/19); Retest for BPPV and  treat as appropriate, reassess HEP, progress strength and balance    PT Home Exercise Plan  [id number]    Consulted and Agree with Plan of Care  Patient       Patient will benefit from skilled therapeutic intervention in order to improve the following deficits and impairments:  Abnormal gait, Decreased balance, Decreased endurance, Decreased mobility, Difficulty walking, Dizziness, Decreased activity tolerance, Decreased coordination, Decreased strength  Visit Diagnosis: Dizziness and giddiness  Unsteadiness on feet  Muscle weakness (generalized)  Other abnormalities of gait and mobility     Problem List Patient Active Problem List   Diagnosis Date Noted  . Cellulitis of leg, right 08/28/2018  . Diarrhea 08/28/2018  . Opacity of lung on imaging study 08/28/2018  . Anemia of chronic disease 08/28/2018  . Chronic diastolic CHF (congestive heart failure) ([place]) 08/28/2018  . Essential hypertension 08/28/2018  . CAD (coronary artery disease) 08/28/2018  . Diabetes mellitus type 2, diet-controlled ([place]) 08/28/2018  . CKD (chronic kidney disease) stage 3, GFR 30-59 ml/min 08/28/2018   8:30 AM, 03/15/19 Etta Grandchild, PT, DPT Physical Therapist - [facility]  Outpatient Physical Therapy- [place] [phone]     Etta Grandchild 03/15/2019, 8:09 AM  [place] MAIN [facility] SERVICES [address]  [place], [place], [address] Phone: [phone]   Fax:  [phone]  Name: Gail Page MRN: [id number] Date of Birth: 1930/05/03

## 2019-03-20 ENCOUNTER — Other Ambulatory Visit: Payer: Self-pay

## 2019-03-20 ENCOUNTER — Ambulatory Visit: Payer: Medicare Other

## 2019-03-20 VITALS — BP 141/62 | HR 52

## 2019-03-20 DIAGNOSIS — M6281 Muscle weakness (generalized): Secondary | ICD-10-CM

## 2019-03-20 DIAGNOSIS — R2681 Unsteadiness on feet: Secondary | ICD-10-CM

## 2019-03-20 NOTE — Therapy (Signed)
[place] MAIN [facility] SERVICES [address] [place], [place], [address] Phone: [phone]   Fax:  [phone]  Physical Therapy Treatment  Patient Details  Name: Gail Page MRN: [id number] Date of Birth: [birth year] Referring Provider (PT): Dr. Carloyn Manner   Encounter Date: 03/20/2019  PT End of Session - 03/20/19 0859    Visit Number  22    Number of Visits  59    Date for PT Re-Evaluation  05/19/19    Authorization Type  eval: 11/09/18,    PT Start Time  0846    PT Stop Time  0930    PT Time Calculation (min)  44 min    Equipment Utilized During Treatment  Gait belt    Activity Tolerance  Patient tolerated treatment well;No increased pain    Behavior During Therapy  WFL for tasks assessed/performed       Past Medical History:  Diagnosis Date  . Anemia   . CHF (congestive heart failure) ([place])   . Coronary artery disease   . Diabetes mellitus without complication ([place])   . Lymphadenopathy   . Neuropathy   . Renal disorder   . Stroke [facility])     Past Surgical History:  Procedure Laterality Date  . CHOLECYSTECTOMY    . TONSILLECTOMY    . TOTAL HIP ARTHROPLASTY      Vitals:   03/20/19 0852  BP: (!) 141/62  Pulse: (!) 52  SpO2: 99%    Subjective Assessment - 03/20/19 0859    Subjective  Pt doing well this date. Denies any pain upon arrival today with the exception of chronic L knee pain which activity. No stumbles or falls since last visit. No specific questions or concerns currently.    Patient is accompained by:  Family member    Pertinent History  Pt complains of unsteadiness/dizziness that initially began after a CVA in 1981, intensified after a MI in 2017, and that she feels she has become even more unsteady recently.  She decribes it is a lightheaded feeling, stating that she feels it all the time, but it fluctuates.  She states that her symptoms are worse when she hasn't eaten or after Lasix.  She reports that she  has had BPPV before, but that she recently was evaluated by an ENT, stating that she did not have BPPV during her evaluation.  She denies any spinning sensations.  She reports 1 bad fall recently that lead to a hospitalization, and since then, she has completed some HH PT and moved in with her daughter due to safety concerns.    Limitations  Standing;Walking;House hold activities;Lifting    Currently in Pain?  No/denies         TREATMENT   Therapeutic Exercise NuStep L2-3 x 49mnutes for warm-up during history,4 minutes unbilled;Vitals monitored intermittently and pt requires one seated rest break due to DOE however SpO2remains >90% throughout; Seated marches with3# ankle weight (AW) x 20 bilateral; Seated LAQ with3# AW x 20 RLE, attempted LLE but discontinued within first 1-2 reps due to pain; Seated clams with green tband x 20 bilateral; Seated adductorisometric squeeze with ball, 3s hold x 20 bilateral; Standing hip flexion marching with 3# AW x 20 bilateral; Seated toe raises with 3# AW on forefoot x 20 bilateral; Seated HS curls withgreentband resistance x 20bilateral; Standingbilateralheel raises with BUE handheld support x 10; Sit to stand with BUE handheld support x 5, discontinued due to increase in L knee pain;   Neuromuscular  Re-education  Airex WBOS/NBOS eyes closed x 30s each; 6" Airex step taps alternating LE x 10 on each side; Static balance staggered stance one foot on Airex and one on 6" step x 30s each; Airex NBOS dynamic reaching with head/eye follow and therapist adjusting height/distance of each cone with return pass on opposite side x multiple bouts on each side;   Pt educated throughout session about proper posture and technique with exercises. Improved exercise technique, movement at target joints, use of target muscles after min to mod verbal, visual, tactile cues.   Ptcontinues todemonstrategood motivation throughout today's session.  She declines offers to test and treat her BPPV on this date as she reports that it has not been a significant issue for her. Generally pt doesn't enjoy performing canal repositioning maneuvers. She is able to complete seated and standing strengthening exercises today as well as balance activities. Progressed to 3# ankle weights with hip strengthening. Left lower extremity remains somewhat limited in strengthening due to knee pain. Pt encouraged to continue HEP and follow up as scheduled.Pt will benefit from PT services to address deficits in strength, balance, and mobility in order to return to full function at home.                         PT Short Term Goals - 02/24/19 1018      PT SHORT TERM GOAL #1   Title  Pt will be independent with HEP in order to improve strength and balance in order to decrease fall risk and improve function at home.    Time  6    Period  Weeks    Status  Achieved    Target Date  12/21/18        PT Long Term Goals - 02/24/19 1018      PT LONG TERM GOAL #1   Title  Pt will improve BERG to >45 in order to decrease her fall risk    Baseline  11/09/18: 29/56; 01/04/19: 40/56; 02/24/19: 41/56    Time  12    Period  Weeks    Status  Partially Met    Target Date  05/19/19      PT LONG TERM GOAL #2   Title  Pt will decrease 5TSTS to <14.8s in order to met normative values for her demographic age range.    Baseline  11/09/18: 29.15s With intermittent use of the wall for steadying and CGA throughout due to unsteadiness (performed in a chair without armrests); 01/04/19: 21.6s one LOB and CGA throughout; 02/24/19: 24.2s one LOB, CGA throughout;    Time  12    Period  Weeks    Status  Partially Met    Target Date  05/19/19      PT LONG TERM GOAL #3   Title  Pt will decrease TUG to below 14 seconds/decrease in order to demonstrate decreased fall risk.    Baseline  11/09/2018: 14.22s with a rollator; 01/04/19: 14.0s with a rollator; 02/24/19: 16.5s  with rollator    Time  12    Period  Weeks    Status  Partially Met    Target Date  05/19/19      PT LONG TERM GOAL #4   Title  Pt will decrease DHI score by at least 18 points in order to demonstrate clinically significant reduction in disability    Baseline  11/09/18: 70/100; 01/04/19: 66/100    Time  12    Period  Weeks    Status  Deferred    Target Date  05/19/19            Plan - 03/20/19 0901    Clinical Impression Statement  Pt continues to demonstrate good motivation throughout today's session. She declines offers to test and treat her BPPV on this date as she reports that it has not been a significant issue for her. Generally pt doesn't enjoy performing canal repositioning maneuvers. She is able to complete seated and standing strengthening exercises today as well as balance activities. Progressed to 3# ankle weights with hip strengthening. Left lower extremity remains somewhat limited in strengthening due to knee pain. Pt encouraged to continue HEP and follow up as scheduled. Pt will benefit from PT services to address deficits in strength, balance, and mobility in order to return to full function at home.    Personal Factors and Comorbidities  Age;Behavior Pattern;Comorbidity 3+;Past/Current Experience;Time since onset of injury/illness/exacerbation;Fitness    Comorbidities  hx of CVA, hx of MI, DM, CHF, Anemia, neuropathy, CAD, lymphedema    Examination-Activity Limitations  Lift;Squat;Caring for Others;Locomotion Level;Stairs;Carry;Stand    Examination-Participation Restrictions  Community Activity    Stability/Clinical Decision Making  Evolving/Moderate complexity    Clinical Decision Making  Moderate    Rehab Potential  Poor    PT Frequency  2x / week    PT Duration  12 weeks    PT Treatment/Interventions  ADLs/Self Care Home Management;Canalith Repostioning;Cryotherapy;Electrical Stimulation;Moist Heat;Traction;Ultrasound;DME Instruction;Gait training;Stair  training;Functional mobility training;Therapeutic activities;Therapeutic exercise;Balance training;Neuromuscular re-education;Patient/family education;Manual techniques;Dry needling;Taping;Vestibular;Spinal Manipulations;Joint Manipulations;Energy conservation;Aquatic Therapy;Iontophoresis 25m/ml Dexamethasone    PT Next Visit Plan  Retest for BPPV and treat as appropriate, reassess HEP, progress strength and balance    PT Home Exercise Plan  [id number]    Consulted and Agree with Plan of Care  Patient       Patient will benefit from skilled therapeutic intervention in order to improve the following deficits and impairments:  Abnormal gait, Decreased balance, Decreased endurance, Decreased mobility, Difficulty walking, Dizziness, Decreased activity tolerance, Decreased coordination, Decreased strength  Visit Diagnosis: Unsteadiness on feet  Muscle weakness (generalized)     Problem List Patient Active Problem List   Diagnosis Date Noted  . Cellulitis of leg, right 08/28/2018  . Diarrhea 08/28/2018  . Opacity of lung on imaging study 08/28/2018  . Anemia of chronic disease 08/28/2018  . Chronic diastolic CHF (congestive heart failure) ([place] 08/28/2018  . Essential hypertension 08/28/2018  . CAD (coronary artery disease) 08/28/2018  . Diabetes mellitus type 2, diet-controlled [place] 08/28/2018  . CKD (chronic kidney disease) stage 3, GFR 30-59 ml/min 08/28/2018   JPhillips GroutPT, DPT, GCS  Timberly Page 03/20/2019, 9:57 AM  [place] [facility]SERV[facility] [address] [address]Phone: [phone]  Fax:  [phone] Name: Gail YottMRN: [id number] of Birth: [birth year]

## 2019-03-21 ENCOUNTER — Other Ambulatory Visit: Payer: Self-pay | Admitting: Gastroenterology

## 2019-03-21 DIAGNOSIS — K449 Diaphragmatic hernia without obstruction or gangrene: Secondary | ICD-10-CM

## 2019-03-22 ENCOUNTER — Other Ambulatory Visit: Payer: Self-pay | Admitting: Otolaryngology

## 2019-03-22 ENCOUNTER — Encounter: Payer: Self-pay | Admitting: Physical Therapy

## 2019-03-22 ENCOUNTER — Other Ambulatory Visit (HOSPITAL_COMMUNITY): Payer: Self-pay | Admitting: Otolaryngology

## 2019-03-22 ENCOUNTER — Other Ambulatory Visit: Payer: Self-pay

## 2019-03-22 ENCOUNTER — Ambulatory Visit: Payer: Medicare Other | Admitting: Physical Therapy

## 2019-03-22 DIAGNOSIS — R2689 Other abnormalities of gait and mobility: Secondary | ICD-10-CM

## 2019-03-22 DIAGNOSIS — M6281 Muscle weakness (generalized): Secondary | ICD-10-CM

## 2019-03-22 DIAGNOSIS — R42 Dizziness and giddiness: Secondary | ICD-10-CM

## 2019-03-22 DIAGNOSIS — R2681 Unsteadiness on feet: Secondary | ICD-10-CM | POA: Diagnosis not present

## 2019-03-22 NOTE — Therapy (Signed)
[place] MAIN [facility] SERVICES [address] [place], [place], [address] Phone: [phone]   Fax:  [phone]  Physical Therapy Treatment  Patient Details  Name: Gail Page MRN: [id number] Date of Birth: 1930/09/06 Referring Provider (PT): Dr. Carloyn Manner   Encounter Date: 03/22/2019  PT End of Session - 03/22/19 0808    Visit Number  23    Number of Visits  11    Date for PT Re-Evaluation  05/19/19    Authorization Type  eval: 11/09/18,    PT Start Time  0803    PT Stop Time  0845    PT Time Calculation (min)  42 min    Equipment Utilized During Treatment  Gait belt    Activity Tolerance  Patient tolerated treatment well;No increased pain    Behavior During Therapy  WFL for tasks assessed/performed       Past Medical History:  Diagnosis Date  . Anemia   . CHF (congestive heart failure) ([place])   . Coronary artery disease   . Diabetes mellitus without complication ([place])   . Lymphadenopathy   . Neuropathy   . Renal disorder   . Stroke [facility])     Past Surgical History:  Procedure Laterality Date  . CHOLECYSTECTOMY    . TONSILLECTOMY    . TOTAL HIP ARTHROPLASTY      There were no vitals filed for this visit.  Subjective Assessment - 03/22/19 0806    Subjective  Patient reports doing well; Reports less pain in left knee today. She reports working on her HEP but states that it is difficult with all the animals in the house.    Patient is accompained by:  Family member    Pertinent History  Pt complains of unsteadiness/dizziness that initially began after a CVA in 1981, intensified after a MI in 2017, and that she feels she has become even more unsteady recently.  She decribes it is a lightheaded feeling, stating that she feels it all the time, but it fluctuates.  She states that her symptoms are worse when she hasn't eaten or after Lasix.  She reports that she has had BPPV before, but that she recently was evaluated by an ENT,  stating that she did not have BPPV during her evaluation.  She denies any spinning sensations.  She reports 1 bad fall recently that lead to a hospitalization, and since then, she has completed some HH PT and moved in with her daughter due to safety concerns.    Limitations  Standing;Walking;House hold activities;Lifting    Currently in Pain?  Yes    Pain Score  3     Pain Location  Knee    Pain Orientation  Left    Pain Descriptors / Indicators  Aching;Sore    Pain Type  Chronic pain    Pain Onset  More than a month ago    Pain Frequency  Constant    Aggravating Factors   certain movements    Pain Relieving Factors  ice/heat intermittently    Effect of Pain on Daily Activities  pushes through    Multiple Pain Sites  No            TREATMENT   Therapeutic Exercise NuStep L2x 63mnutes for warm-up (unbilled);  Seated with 3# ankle weights:  Seated marches with3# ankle weight (AW) x 20 bilateral; Seated LAQ with3# AW x 20 RLE, attempted LLE but discontinued after 5 repetitions due to increased knee discomfort;  Seated  adductorisometric squeeze with ball, 3s hold x 20 bilateral; Seated toe raises with 3# AW on forefoot x 20 bilateral;   Standing with 3# ankle weights:  Standingbilateralheel raises with BUE handheld support x 20; Standing hip flexion marching with 3# AW x 20 bilateral; Standing hip abduction 3# ankle weight x20 bilaterally;  Standing hip extension 3# x20 bilaterally; Required min VCs to increase gluteal activation/hip extension for better strengthening;    Neuromuscular Re-education Standing on airex: Feet shoulder width apart: eyes open 30 sec hold, eyes closed 15 sec hold x2 reps each with CGA for safety; increased unsteadiness noted with eyes closed;  Modified tandem stance, unsupported standing, head turns side/side x10 reps each foot in front; required CGA for safety and cues for erect posture/weight shift for better stance control. Provided  patient with targets for gaze stabilization to help reduce dizziness.   Pt educated throughout session about proper posture and technique with exercises. Improved exercise technique, movement at target joints, use of target muscles after min to mod verbal, visual, tactile cues.   Ptcontinues todemonstrategood motivation throughout today's session. She continues to have shortness of breath with advanced activity requiring short seated rest breaks. Vitals monitored throughout session, SPo2 >90% throughout. She required min Vcs for proper exercise technique including to slow down LE movement for better motor control/strengthening. Alternated between seated/standing exercise to increase strengthening tolerance and reduce fatigue.                      PT Education - 03/22/19 0808    Education Details  exercise technique/HEP    Person(s) Educated  Patient    Methods  Explanation;Verbal cues    Comprehension  Verbalized understanding;Returned demonstration;Verbal cues required;Need further instruction       PT Short Term Goals - 02/24/19 1018      PT SHORT TERM GOAL #1   Title  Pt will be independent with HEP in order to improve strength and balance in order to decrease fall risk and improve function at home.    Time  6    Period  Weeks    Status  Achieved    Target Date  12/21/18        PT Long Term Goals - 02/24/19 1018      PT LONG TERM GOAL #1   Title  Pt will improve BERG to >45 in order to decrease her fall risk    Baseline  11/09/18: 29/56; 01/04/19: 40/56; 02/24/19: 41/56    Time  12    Period  Weeks    Status  Partially Met    Target Date  05/19/19      PT LONG TERM GOAL #2   Title  Pt will decrease 5TSTS to <14.8s in order to met normative values for her demographic age range.    Baseline  11/09/18: 29.15s With intermittent use of the wall for steadying and CGA throughout due to unsteadiness (performed in a chair without armrests); 01/04/19: 21.6s  one LOB and CGA throughout; 02/24/19: 24.2s one LOB, CGA throughout;    Time  12    Period  Weeks    Status  Partially Met    Target Date  05/19/19      PT LONG TERM GOAL #3   Title  Pt will decrease TUG to below 14 seconds/decrease in order to demonstrate decreased fall risk.    Baseline  11/09/2018: 14.22s with a rollator; 01/04/19: 14.0s with a rollator; 02/24/19: 16.5s with rollator  Time  12    Period  Weeks    Status  Partially Met    Target Date  05/19/19      PT LONG TERM GOAL #4   Title  Pt will decrease DHI score by at least 18 points in order to demonstrate clinically significant reduction in disability    Baseline  11/09/18: 70/100; 01/04/19: 66/100    Time  12    Period  Weeks    Status  Deferred    Target Date  05/19/19            Plan - 03/22/19 6599    Clinical Impression Statement  Patient motivated and participated well within session. Instructed patient in advanced LE strengthening with increased repetition. Alternated between seated/standing exercise to help conserve energy and reduce fatigue. Patient does become short of breath with prolonged standing. Vitals monitored, SPO2 levels >90% throughout. Patient continues to have shakiness and unsteadiness with standing on compliant surfaces especially with eyes closed. She would benefit from additional skilled PT intervention to improve strength, balance and mobility;    Personal Factors and Comorbidities  Age;Behavior Pattern;Comorbidity 3+;Past/Current Experience;Time since onset of injury/illness/exacerbation;Fitness    Comorbidities  hx of CVA, hx of MI, DM, CHF, Anemia, neuropathy, CAD, lymphedema    Examination-Activity Limitations  Lift;Squat;Caring for Others;Locomotion Level;Stairs;Carry;Stand    Examination-Participation Restrictions  Community Activity    Stability/Clinical Decision Making  Evolving/Moderate complexity    Rehab Potential  Poor    PT Frequency  2x / week    PT Duration  12 weeks    PT  Treatment/Interventions  ADLs/Self Care Home Management;Canalith Repostioning;Cryotherapy;Electrical Stimulation;Moist Heat;Traction;Ultrasound;DME Instruction;Gait training;Stair training;Functional mobility training;Therapeutic activities;Therapeutic exercise;Balance training;Neuromuscular re-education;Patient/family education;Manual techniques;Dry needling;Taping;Vestibular;Spinal Manipulations;Joint Manipulations;Energy conservation;Aquatic Therapy;Iontophoresis 23m/ml Dexamethasone    PT Next Visit Plan  Retest for BPPV and treat as appropriate, reassess HEP, progress strength and balance    PT Home Exercise Plan  [id number]    Consulted and Agree with Plan of Care  Patient       Patient will benefit from skilled therapeutic intervention in order to improve the following deficits and impairments:  Abnormal gait, Decreased balance, Decreased endurance, Decreased mobility, Difficulty walking, Dizziness, Decreased activity tolerance, Decreased coordination, Decreased strength  Visit Diagnosis: Unsteadiness on feet  Muscle weakness (generalized)  Dizziness and giddiness  Other abnormalities of gait and mobility     Problem List Patient Active Problem List   Diagnosis Date Noted  . Cellulitis of leg, right 08/28/2018  . Diarrhea 08/28/2018  . Opacity of lung on imaging study 08/28/2018  . Anemia of chronic disease 08/28/2018  . Chronic diastolic CHF (congestive heart failure) ([place] 08/28/[place]  . Essential hypertension 08/28/2018  . CAD (coronary artery disease) 08/28/2018  . Diabetes mellitus type 2, diet-controlled ([place] 08/28/2018  . CKD (chronic kidney disease) stage 3, GFR 30-59 ml/min 08/28/2018    Kandise Riehle PT, DPT 03/22/2019, 8:55 AM  [place] [facility] CareSERVICES [address] [place] [address]Phone: [phone]  Fax:  [phone] Name: Gail LederMRN: [id number] of Birth: 705-31-1932

## 2019-03-23 ENCOUNTER — Other Ambulatory Visit: Payer: Self-pay

## 2019-03-23 ENCOUNTER — Ambulatory Visit
Admission: RE | Admit: 2019-03-23 | Discharge: 2019-03-23 | Disposition: A | Discharge status: Home / Self Care | Payer: Medicare Other | Source: Ambulatory Visit | Attending: Gastroenterology | Admitting: Gastroenterology

## 2019-03-23 DIAGNOSIS — K449 Diaphragmatic hernia without obstruction or gangrene: Secondary | ICD-10-CM | POA: Diagnosis present

## 2019-03-27 ENCOUNTER — Other Ambulatory Visit: Payer: Self-pay

## 2019-03-27 ENCOUNTER — Ambulatory Visit: Payer: Medicare Other | Attending: Otolaryngology

## 2019-03-27 DIAGNOSIS — M6281 Muscle weakness (generalized): Secondary | ICD-10-CM | POA: Diagnosis present

## 2019-03-27 DIAGNOSIS — R2681 Unsteadiness on feet: Secondary | ICD-10-CM | POA: Insufficient documentation

## 2019-03-27 DIAGNOSIS — R2689 Other abnormalities of gait and mobility: Secondary | ICD-10-CM

## 2019-03-27 NOTE — Therapy (Signed)
[place] MAIN [facility] SERVICES [address] [place], [place], [address] Phone: [phone]   Fax:  [phone]  Physical Therapy Treatment  Patient Details  Name: Gail Page MRN: [id number] Date of Birth: 08-Sep-1930 Referring Provider (PT): Dr. Carloyn Manner   Encounter Date: 03/27/2019  PT End of Session - 03/27/19 0806    Visit Number  24    Number of Visits  59    Date for PT Re-Evaluation  05/19/19    Authorization Type  eval: 11/09/18,    PT Start Time  0801    PT Stop Time  0845    PT Time Calculation (min)  44 min    Equipment Utilized During Treatment  Gait belt    Activity Tolerance  Patient tolerated treatment well;No increased pain    Behavior During Therapy  WFL for tasks assessed/performed       Past Medical History:  Diagnosis Date  . Anemia   . CHF (congestive heart failure) ([place])   . Coronary artery disease   . Diabetes mellitus without complication ([place])   . Lymphadenopathy   . Neuropathy   . Renal disorder   . Stroke [facility])     Past Surgical History:  Procedure Laterality Date  . CHOLECYSTECTOMY    . TONSILLECTOMY    . TOTAL HIP ARTHROPLASTY      There were no vitals filed for this visit.  Subjective Assessment - 03/27/19 0803    Subjective  Patient reports doing well. No pain upon arrival today however she does report that she had some R elbow pain over the last few days. She was pretty sore last week with ther therapy. Otherwise no specific questions or concerns.    Patient is accompained by:  Family member    Pertinent History  Pt complains of unsteadiness/dizziness that initially began after a CVA in 1981, intensified after a MI in 2017, and that she feels she has become even more unsteady recently.  She decribes it is a lightheaded feeling, stating that she feels it all the time, but it fluctuates.  She states that her symptoms are worse when she hasn't eaten or after Lasix.  She reports that she has  had BPPV before, but that she recently was evaluated by an ENT, stating that she did not have BPPV during her evaluation.  She denies any spinning sensations.  She reports 1 bad fall recently that lead to a hospitalization, and since then, she has completed some HH PT and moved in with her daughter due to safety concerns.    Limitations  Standing;Walking;House hold activities;Lifting    Currently in Pain?  No/denies         TREATMENT   Therapeutic Exercise NuStep L2 x 47mnutes for warm-up during history,4 minutes unbilled;Vitals monitored intermittently and pt requires one seated rest break due to DOE however SpO2remains >90% throughout; Seated marches with3# ankle weight (AW) x 20 bilateral; Seated LAQ with3# AW x 20 RLE, too painful to perform on LLE; Seated clams with green tband x 20 bilateral; Seated adductorisometric squeeze with ball, 3s hold x 20 bilateral; Standing hip flexion marching with 3# AW x 20 bilateral; Standing mini squat with bilateral hand held support  Standing with 3# ankle weights:  Standingbilateralheel raises with BUE handheld support x 20; Standing hip flexion marching with 3# AW x 20 bilateral; Standing hip abduction 3# ankle weight x20 bilaterally;  Standing hip extension 3# x20 bilaterally; Required min VCs to increase gluteal activation/hip  extension for better strengthening;   Seated HS curls withgreentband resistance x 20bilateral; Standingbilateralheel raises with BUE handheld support x 10;   Neuromuscular Re-education Airex NBOS eyes open/closed x 30s each; Airex NBOS horizontal and vertical head turns x 30s each; Airex NBOS horizonta head and body turns x 30s; Airex NBOS dynamic reaching with head/eye follow and therapist adjusting height/distance of each cone with return pass on opposite side x multiple bouts on each side; Static balance staggered stance one foot on Airex and one on 6" step 30s x 2 each;    Pt educated  throughout session about proper posture and technique with exercises. Improved exercise technique, movement at target joints, use of target muscles after min to mod verbal, visual, tactile cues.   Ptcontinues todemonstrategood motivation throughout today's session. She declines offers to test and treat her BPPV on this date and she reports that she has not had any vertigo lately. She is able to complete seated and standing strengthening exercises today as well as balance activities. Left lower extremity remains somewhat limited in strengthening due to left knee pain. Pt encouraged to continue HEP and follow up as scheduled.Pt will benefit from PT services to address deficits in strength, balance, and mobility in order to return to full function at home.                        PT Short Term Goals - 02/24/19 1018      PT SHORT TERM GOAL #1   Title  Pt will be independent with HEP in order to improve strength and balance in order to decrease fall risk and improve function at home.    Time  6    Period  Weeks    Status  Achieved    Target Date  12/21/18        PT Long Term Goals - 02/24/19 1018      PT LONG TERM GOAL #1   Title  Pt will improve BERG to >45 in order to decrease her fall risk    Baseline  11/09/18: 29/56; 01/04/19: 40/56; 02/24/19: 41/56    Time  12    Period  Weeks    Status  Partially Met    Target Date  05/19/19      PT LONG TERM GOAL #2   Title  Pt will decrease 5TSTS to <14.8s in order to met normative values for her demographic age range.    Baseline  11/09/18: 29.15s With intermittent use of the wall for steadying and CGA throughout due to unsteadiness (performed in a chair without armrests); 01/04/19: 21.6s one LOB and CGA throughout; 02/24/19: 24.2s one LOB, CGA throughout;    Time  12    Period  Weeks    Status  Partially Met    Target Date  05/19/19      PT LONG TERM GOAL #3   Title  Pt will decrease TUG to below 14 seconds/decrease  in order to demonstrate decreased fall risk.    Baseline  11/09/2018: 14.22s with a rollator; 01/04/19: 14.0s with a rollator; 02/24/19: 16.5s with rollator    Time  12    Period  Weeks    Status  Partially Met    Target Date  05/19/19      PT LONG TERM GOAL #4   Title  Pt will decrease DHI score by at least 18 points in order to demonstrate clinically significant reduction in disability    Baseline  11/09/18: 70/100; 01/04/19: 66/100    Time  12    Period  Weeks    Status  Deferred    Target Date  05/19/19            Plan - 03/27/19 0806    Clinical Impression Statement  Pt continues to demonstrate good motivation throughout today's session. She declines offers to test and treat her BPPV on this date and she reports that she has not had any vertigo lately. She is able to complete seated and standing strengthening exercises today as well as balance activities. Left lower extremity remains somewhat limited in strengthening due to left knee pain. Pt encouraged to continue HEP and follow up as scheduled. Pt will benefit from PT services to address deficits in strength, balance, and mobility in order to return to full function at home.    Personal Factors and Comorbidities  Age;Behavior Pattern;Comorbidity 3+;Past/Current Experience;Time since onset of injury/illness/exacerbation;Fitness    Comorbidities  hx of CVA, hx of MI, DM, CHF, Anemia, neuropathy, CAD, lymphedema    Examination-Activity Limitations  Lift;Squat;Caring for Others;Locomotion Level;Stairs;Carry;Stand    Examination-Participation Restrictions  Community Activity    Stability/Clinical Decision Making  Evolving/Moderate complexity    Rehab Potential  Poor    PT Frequency  2x / week    PT Duration  12 weeks    PT Treatment/Interventions  ADLs/Self Care Home Management;Canalith Repostioning;Cryotherapy;Electrical Stimulation;Moist Heat;Traction;Ultrasound;DME Instruction;Gait training;Stair training;Functional mobility  training;Therapeutic activities;Therapeutic exercise;Balance training;Neuromuscular re-education;Patient/family education;Manual techniques;Dry needling;Taping;Vestibular;Spinal Manipulations;Joint Manipulations;Energy conservation;Aquatic Therapy;Iontophoresis 21m/ml Dexamethasone    PT Next Visit Plan  Retest for BPPV and treat as appropriate, reassess HEP, progress strength and balance    PT Home Exercise Plan  [id number]    Consulted and Agree with Plan of Care  Patient       Patient will benefit from skilled therapeutic intervention in order to improve the following deficits and impairments:  Abnormal gait, Decreased balance, Decreased endurance, Decreased mobility, Difficulty walking, Dizziness, Decreased activity tolerance, Decreased coordination, Decreased strength  Visit Diagnosis: Unsteadiness on feet  Muscle weakness (generalized)  Other abnormalities of gait and mobility     Problem List Patient Active Problem List   Diagnosis Date Noted  . Cellulitis of leg, right 08/28/2018  . Diarrhea 08/28/2018  . Opacity of lung on imaging study 08/28/2018  . Anemia of chronic disease 08/28/2018  . Chronic diastolic CHF (congestive heart failure) ([place] 08/28/2018  . Essential hypertension 08/28/2018  . CAD (coronary artery disease) 08/28/2018  . Diabetes mellitus type 2, diet-controlled ([place] 08/28/2018  . CKD (chronic kidney disease) stage 3, GFR 30-59 ml/min 08/28/2018   JPhillips GroutPT, DPT, GCS  Seiya Silsby 03/27/2019, 9:19 AM  [place]Arrow RockMAIN RPho[facility] CenterSERVICES [address]RDardenne Prairie [place] [address]Phone: [phone]  Fax:  [phone] Name: YMaddisen VoughtMRN: [id number] of Birth: 704/12/1930

## 2019-03-29 ENCOUNTER — Other Ambulatory Visit: Payer: Self-pay

## 2019-03-29 ENCOUNTER — Ambulatory Visit: Payer: Medicare Other

## 2019-03-29 DIAGNOSIS — R2681 Unsteadiness on feet: Secondary | ICD-10-CM | POA: Diagnosis not present

## 2019-03-29 DIAGNOSIS — M6281 Muscle weakness (generalized): Secondary | ICD-10-CM

## 2019-03-29 NOTE — Therapy (Signed)
[place] MAIN [facility] SERVICES [address] [place], [place], [address] Phone: [phone]   Fax:  [phone]  Physical Therapy Treatment  Patient Details  Name: Gail Page MRN: [id number] Date of Birth: [birth year] Referring Provider (PT): Dr. Carloyn Manner   Encounter Date: 03/29/2019  PT End of Session - 03/29/19 0806    Visit Number  25    Number of Visits  59    Date for PT Re-Evaluation  05/19/19    Authorization Type  eval: 11/09/18,    PT Start Time  0800    PT Stop Time  0845    PT Time Calculation (min)  45 min    Equipment Utilized During Treatment  Gait belt    Activity Tolerance  Patient tolerated treatment well;No increased pain    Behavior During Therapy  WFL for tasks assessed/performed       Past Medical History:  Diagnosis Date  . Anemia   . CHF (congestive heart failure) ([place])   . Coronary artery disease   . Diabetes mellitus without complication ([place])   . Lymphadenopathy   . Neuropathy   . Renal disorder   . Stroke [facility])     Past Surgical History:  Procedure Laterality Date  . CHOLECYSTECTOMY    . TONSILLECTOMY    . TOTAL HIP ARTHROPLASTY      There were no vitals filed for this visit.  Subjective Assessment - 03/29/19 0759    Subjective  Patient reports doing well. No pain upon arrival today however R elbow continues to bother her "a little bit." No excessive soreness after last therapy session. No specific questions or concerns.    Patient is accompained by:  Family member    Pertinent History  Pt complains of unsteadiness/dizziness that initially began after a CVA in 1981, intensified after a MI in 2017, and that she feels she has become even more unsteady recently.  She decribes it is a lightheaded feeling, stating that she feels it all the time, but it fluctuates.  She states that her symptoms are worse when she hasn't eaten or after Lasix.  She reports that she has had BPPV before, but that she  recently was evaluated by an ENT, stating that she did not have BPPV during her evaluation.  She denies any spinning sensations.  She reports 1 bad fall recently that lead to a hospitalization, and since then, she has completed some HH PT and moved in with her daughter due to safety concerns.    Limitations  Standing;Walking;House hold activities;Lifting    Currently in Pain?  No/denies          TREATMENT   Therapeutic Exercise NuStep L2/3 x 98mnutes for warm-up during history,3 minutes unbilled;Vitals monitored intermittently and pt requires one seated rest break due to DOE however SpO2remains greater than or equal to 90% throughout; Precor leg press 55# 2 x 20; Seated marches with4# ankle weight (AW) x 20bilateral; Seated LAQ with4# AW x 20 RLE, too painful to perform on LLE; Seated clams withgreen tbandx20bilateral; Seated adductorisometric squeeze withball, 3s holdx 20bilateral; Standing hip flexion marching with 4# AW x 20 bilateral; Standing mini squat with bilateral hand held support  Standing with 4# ankle weights: Standingbilateralheel raises with BUE handheld support x20; Standing hip flexion marching x 20 bilateral; Standing hip abduction x 20 bilaterally;  Standing hip extension x 20 bilaterally; Required min VCs to increase gluteal activation/hip extension for better strengthening;  Seated HS curls withgreentband resistance x20bilateral;  Neuromuscular Re-education Forward and backward ambulation in // bars without UE support x 4 lengths each; Side stepping in // bars without UE support x 2 lengths each; Semitandem balance alternating forward LE 2 x 30s each; Airex NBOS eyes closed x 30s each; Airex 6" alternating step taps without UE support x 10 each; Static balance staggered stance one foot on Airex and one on 6" step 30s x 2 each;   Pt educated throughout session about proper posture and technique with exercises. Improved  exercise technique, movement at target joints, use of target muscles after min to mod verbal, visual, tactile cues.   Ptcontinues todemonstrategood motivation throughout today's session.She continues to report no further episodes of vertigo. She is able to continue exercises today and increase her ankle weights to 4 pounds. Balance and strength exercises modified to minimize L knee pain. Increased difficulty with balance on unstable Airex pad.  Pt encouraged to continue HEP and follow up as scheduled.Pt will benefit from PT services to address deficits in strength, balance, and mobility in order to return to full function at home.                       PT Short Term Goals - 02/24/19 1018      PT SHORT TERM GOAL #1   Title  Pt will be independent with HEP in order to improve strength and balance in order to decrease fall risk and improve function at home.    Time  6    Period  Weeks    Status  Achieved    Target Date  12/21/18        PT Long Term Goals - 02/24/19 1018      PT LONG TERM GOAL #1   Title  Pt will improve BERG to >45 in order to decrease her fall risk    Baseline  11/09/18: 29/56; 01/04/19: 40/56; 02/24/19: 41/56    Time  12    Period  Weeks    Status  Partially Met    Target Date  05/19/19      PT LONG TERM GOAL #2   Title  Pt will decrease 5TSTS to <14.8s in order to met normative values for her demographic age range.    Baseline  11/09/18: 29.15s With intermittent use of the wall for steadying and CGA throughout due to unsteadiness (performed in a chair without armrests); 01/04/19: 21.6s one LOB and CGA throughout; 02/24/19: 24.2s one LOB, CGA throughout;    Time  12    Period  Weeks    Status  Partially Met    Target Date  05/19/19      PT LONG TERM GOAL #3   Title  Pt will decrease TUG to below 14 seconds/decrease in order to demonstrate decreased fall risk.    Baseline  11/09/2018: 14.22s with a rollator; 01/04/19: 14.0s with a rollator;  02/24/19: 16.5s with rollator    Time  12    Period  Weeks    Status  Partially Met    Target Date  05/19/19      PT LONG TERM GOAL #4   Title  Pt will decrease DHI score by at least 18 points in order to demonstrate clinically significant reduction in disability    Baseline  11/09/18: 70/100; 01/04/19: 66/100    Time  12    Period  Weeks    Status  Deferred    Target Date  05/19/19  Plan - 03/29/19 0807    Clinical Impression Statement  Pt continues to demonstrate good motivation throughout today's session. She continues to report no further episodes of vertigo. She is able to continue exercises today and increase her ankle weights to 4 pounds. Balance and strength exercises modified to minimize L knee pain. Increased difficulty with balance on unstable Airex pad.  Pt encouraged to continue HEP and follow up as scheduled. Pt will benefit from PT services to address deficits in strength, balance, and mobility in order to return to full function at home.    Personal Factors and Comorbidities  Age;Behavior Pattern;Comorbidity 3+;Past/Current Experience;Time since onset of injury/illness/exacerbation;Fitness    Comorbidities  hx of CVA, hx of MI, DM, CHF, Anemia, neuropathy, CAD, lymphedema    Examination-Activity Limitations  Lift;Squat;Caring for Others;Locomotion Level;Stairs;Carry;Stand    Examination-Participation Restrictions  Community Activity    Stability/Clinical Decision Making  Evolving/Moderate complexity    Rehab Potential  Poor    PT Frequency  2x / week    PT Duration  12 weeks    PT Treatment/Interventions  ADLs/Self Care Home Management;Canalith Repostioning;Cryotherapy;Electrical Stimulation;Moist Heat;Traction;Ultrasound;DME Instruction;Gait training;Stair training;Functional mobility training;Therapeutic activities;Therapeutic exercise;Balance training;Neuromuscular re-education;Patient/family education;Manual techniques;Dry needling;Taping;Vestibular;Spinal  Manipulations;Joint Manipulations;Energy conservation;Aquatic Therapy;Iontophoresis 45m/ml Dexamethasone    PT Next Visit Plan  Retest for BPPV and treat as appropriate, reassess HEP, progress strength and balance    PT Home Exercise Plan  [id number]    Consulted and Agree with Plan of Care  Patient       Patient will benefit from skilled therapeutic intervention in order to improve the following deficits and impairments:  Abnormal gait, Decreased balance, Decreased endurance, Decreased mobility, Difficulty walking, Dizziness, Decreased activity tolerance, Decreased coordination, Decreased strength  Visit Diagnosis: Unsteadiness on feet  Muscle weakness (generalized)     Problem List Patient Active Problem List   Diagnosis Date Noted  . Cellulitis of leg, right 08/28/2018  . Diarrhea 08/28/2018  . Opacity of lung on imaging study 08/28/2018  . Anemia of chronic disease 08/28/2018  . Chronic diastolic CHF (congestive heart failure) ([place] 08/28/2018  . Essential hypertension 08/28/2018  . CAD (coronary artery disease) 08/28/2018  . Diabetes mellitus type 2, diet-controlled ([place] 08/28/2018  . CKD (chronic kidney disease) stage 3, GFR 30-59 ml/min 08/28/2018   JPhillips GroutPT, DPT, GCS  Amelda Hapke 03/29/2019, 10:47 AM  CClaiborneMAIN RMontevista HospitalSERVICES [address] [address]Phone: [phone]  Fax:  [phone] Name: Gail AlvizoMRN: [id number] of Birth: 7Sep 06, 1932

## 2019-04-03 ENCOUNTER — Ambulatory Visit: Payer: Medicare Other

## 2019-04-03 ENCOUNTER — Encounter: Payer: Self-pay | Admitting: Orthopedic Surgery

## 2019-04-03 ENCOUNTER — Other Ambulatory Visit: Payer: Self-pay

## 2019-04-03 ENCOUNTER — Ambulatory Visit (INDEPENDENT_AMBULATORY_CARE_PROVIDER_SITE_OTHER): Payer: Medicare Other | Admitting: Orthopedic Surgery

## 2019-04-03 VITALS — Ht 62.5 in | Wt 182.0 lb

## 2019-04-03 DIAGNOSIS — M6281 Muscle weakness (generalized): Secondary | ICD-10-CM

## 2019-04-03 DIAGNOSIS — M25562 Pain in left knee: Secondary | ICD-10-CM | POA: Diagnosis not present

## 2019-04-03 DIAGNOSIS — R2681 Unsteadiness on feet: Secondary | ICD-10-CM | POA: Diagnosis not present

## 2019-04-03 DIAGNOSIS — G8929 Other chronic pain: Secondary | ICD-10-CM

## 2019-04-03 MED ORDER — METHYLPREDNISOLONE ACETATE 40 MG/ML IJ SUSP
40.0000 mg | INTRAMUSCULAR | Status: AC | PRN
  Administered 2019-04-03: 40 mg via INTRA_ARTICULAR

## 2019-04-03 MED ORDER — LIDOCAINE HCL 1 % IJ SOLN
1.0000 mL | INTRAMUSCULAR | Status: AC | PRN
  Administered 2019-04-03: 10:00:00 1 mL

## 2019-04-03 NOTE — Therapy (Signed)
[place] MAIN [facility] SERVICES [address] [place], [place], [address] Phone: [phone]   Fax:  [phone]  Physical Therapy Treatment  Patient Details  Name: Gail Page MRN: [id number] Date of Birth: 26-Aug-1930 Referring Provider (PT): Dr. Carloyn Manner   Encounter Date: 04/03/2019  PT End of Session - 04/03/19 0805    Visit Number  26    Number of Visits  59    Date for PT Re-Evaluation  05/19/19    Authorization Type  eval: 11/09/18,    PT Start Time  0800    PT Stop Time  0845    PT Time Calculation (min)  45 min    Equipment Utilized During Treatment  Gait belt    Activity Tolerance  Patient tolerated treatment well;No increased pain    Behavior During Therapy  WFL for tasks assessed/performed       Past Medical History:  Diagnosis Date  . Anemia   . CHF (congestive heart failure) ([place])   . Coronary artery disease   . Diabetes mellitus without complication ([place])   . Lymphadenopathy   . Neuropathy   . Renal disorder   . Stroke [facility])     Past Surgical History:  Procedure Laterality Date  . CHOLECYSTECTOMY    . TONSILLECTOMY    . TOTAL HIP ARTHROPLASTY      There were no vitals filed for this visit.  Subjective Assessment - 04/03/19 0804    Subjective  Patient reports doing well. No pain upon arrival today with the exception of her chronic L knee pain which hurts with activity. She has an appointment to see the orthopedist today to discuss her knee. No specific questions or concerns.    Patient is accompained by:  Family member    Pertinent History  Pt complains of unsteadiness/dizziness that initially began after a CVA in 1981, intensified after a MI in 2017, and that she feels she has become even more unsteady recently.  She decribes it is a lightheaded feeling, stating that she feels it all the time, but it fluctuates.  She states that her symptoms are worse when she hasn't eaten or after Lasix.  She reports that  she has had BPPV before, but that she recently was evaluated by an ENT, stating that she did not have BPPV during her evaluation.  She denies any spinning sensations.  She reports 1 bad fall recently that lead to a hospitalization, and since then, she has completed some HH PT and moved in with her daughter due to safety concerns.    Limitations  Standing;Walking;House hold activities;Lifting    Currently in Pain?  No/denies          TREATMENT   Therapeutic Exercise NuStep L2-4 x 68mnutes for warm-up during history,3 minutes unbilled;Vitals monitored intermittently, no seated rest breaks. SpO2remains greater than or equal to 90% throughout; Precor leg press 70# 2 x 20; Seated marches with4# ankle weight (AW) x 20bilateral; Seated LAQ with4# AW x 20 RLE,too painful to perform on LLE; Seated clams withgreen tbandx20bilateral; Seated adductorisometric squeeze withball, 3s holdx 20bilateral; Seated HS curls withgreentband resistance x20bilateral;  Standing with 4# ankle weights: Standingbilateralheel raises with BUE handheld support x20; Standing hip flexion marching x 20 bilateral; Standing hip abduction x 20 bilaterally;  Standing hip extension x 20 bilaterally;   Standing mini squat with bilateral hand held support x 20;    Neuromuscular Re-education Airex NBOS ball passes around body with head/eye follow and therapist adjusting  ball height between waist and overhead as well as adjusting the distance ball is from body x multiple bouts to each side; Attempted Airex dart board toss but pt struggles to throw so discontinued; Forward and backward ambulation in // bars without UE support x 4 lengths each; Side stepping in // bars without UE support x 2 lengths each; 1/2 foam roll balance without UE support x 30s; 1/2 foam roll tandem balance without UE support 2 x 30s each;   Pt educated throughout session about proper posture and technique with  exercises. Improved exercise technique, movement at target joints, use of target muscles after min to mod verbal, visual, tactile cues.   Ptcontinues todemonstrategood motivation throughout today's session.She continues to report no further episodes of vertigo. She is able to continue exercises today without issue and requires less cues with each session. Discussed possible discharge in the near future for patient to continue independently. Balance and strength exercises modified to minimize L knee pain. Attempted dynamic ball tosses on Airex pad however pt struggles to perform so discontinued. Pt encouraged to continue HEP and follow up as scheduled.Pt will benefit from PT services to address deficits in strength, balance, and mobility in order to return to full function at home.                        PT Short Term Goals - 02/24/19 1018      PT SHORT TERM GOAL #1   Title  Pt will be independent with HEP in order to improve strength and balance in order to decrease fall risk and improve function at home.    Time  6    Period  Weeks    Status  Achieved    Target Date  12/21/18        PT Long Term Goals - 02/24/19 1018      PT LONG TERM GOAL #1   Title  Pt will improve BERG to >45 in order to decrease her fall risk    Baseline  11/09/18: 29/56; 01/04/19: 40/56; 02/24/19: 41/56    Time  12    Period  Weeks    Status  Partially Met    Target Date  05/19/19      PT LONG TERM GOAL #2   Title  Pt will decrease 5TSTS to <14.8s in order to met normative values for her demographic age range.    Baseline  11/09/18: 29.15s With intermittent use of the wall for steadying and CGA throughout due to unsteadiness (performed in a chair without armrests); 01/04/19: 21.6s one LOB and CGA throughout; 02/24/19: 24.2s one LOB, CGA throughout;    Time  12    Period  Weeks    Status  Partially Met    Target Date  05/19/19      PT LONG TERM GOAL #3   Title  Pt will decrease TUG  to below 14 seconds/decrease in order to demonstrate decreased fall risk.    Baseline  11/09/2018: 14.22s with a rollator; 01/04/19: 14.0s with a rollator; 02/24/19: 16.5s with rollator    Time  12    Period  Weeks    Status  Partially Met    Target Date  05/19/19      PT LONG TERM GOAL #4   Title  Pt will decrease DHI score by at least 18 points in order to demonstrate clinically significant reduction in disability    Baseline  11/09/18: 70/100; 01/04/19: 66/100  Time  12    Period  Weeks    Status  Deferred    Target Date  05/19/19            Plan - 04/03/19 0805    Clinical Impression Statement  Pt continues to demonstrate good motivation throughout today's session. She continues to report no further episodes of vertigo. She is able to continue exercises today without issue and requires less cues with each session. Discussed possible discharge in the near future for patient to continue independently. Balance and strength exercises modified to minimize L knee pain. Attempted dynamic ball tosses on Airex pad however pt struggles to perform so discontinued. Pt encouraged to continue HEP and follow up as scheduled. Pt will benefit from PT services to address deficits in strength, balance, and mobility in order to return to full function at home.    Personal Factors and Comorbidities  Age;Behavior Pattern;Comorbidity 3+;Past/Current Experience;Time since onset of injury/illness/exacerbation;Fitness    Comorbidities  hx of CVA, hx of MI, DM, CHF, Anemia, neuropathy, CAD, lymphedema    Examination-Activity Limitations  Lift;Squat;Caring for Others;Locomotion Level;Stairs;Carry;Stand    Examination-Participation Restrictions  Community Activity    Stability/Clinical Decision Making  Evolving/Moderate complexity    Clinical Decision Making  Moderate    Rehab Potential  Poor    PT Frequency  2x / week    PT Duration  12 weeks    PT Treatment/Interventions  ADLs/Self Care Home  Management;Canalith Repostioning;Cryotherapy;Electrical Stimulation;Moist Heat;Traction;Ultrasound;DME Instruction;Gait training;Stair training;Functional mobility training;Therapeutic activities;Therapeutic exercise;Balance training;Neuromuscular re-education;Patient/family education;Manual techniques;Dry needling;Taping;Vestibular;Spinal Manipulations;Joint Manipulations;Energy conservation;Aquatic Therapy;Iontophoresis 62m/ml Dexamethasone    PT Next Visit Plan  Retest for BPPV and treat as appropriate, reassess HEP, progress strength and balance    PT Home Exercise Plan  [id number]    Consulted and Agree with Plan of Care  Patient       Patient will benefit from skilled therapeutic intervention in order to improve the following deficits and impairments:  Abnormal gait, Decreased balance, Decreased endurance, Decreased mobility, Difficulty walking, Dizziness, Decreased activity tolerance, Decreased coordination, Decreased strength  Visit Diagnosis: Unsteadiness on feet  Muscle weakness (generalized)     Problem List Patient Active Problem List   Diagnosis Date Noted  . Cellulitis of leg, right 08/28/2018  . Diarrhea 08/28/2018  . Opacity of lung on imaging study 08/28/2018  . Anemia of chronic disease 08/28/2018  . Chronic diastolic CHF (congestive heart failure) ([place] 08/28/2018  . Essential hypertension 08/28/2018  . CAD (coronary artery disease) 08/28/2018  . Diabetes mellitus type 2, diet-controlled ([place] 08/28/2018  . CKD (chronic kidney disease) stage 3, GFR 30-59 ml/min 08/28/2018   JPhillips GroutPT, DPT, GCS  Avontae Burkhead 04/03/2019, 9:03 AM  [place] [facility]SERV[facility] [address] [address]Phone: [phone]  Fax:  [phone] Name: Gail JareckiMRN: [id number] of Birth: 7Dec 31, 1932

## 2019-04-03 NOTE — Progress Notes (Signed)
Office Visit Note   Patient: Gail Page           Date of Birth: Nov 20, 1930           MRN: [id number] Visit Date: 04/03/2019              Requested by: Cleophas Dunker, MD [address] [place],  [place] [address] PCP: Cleophas Dunker, MD  Chief Complaint  Patient presents with  . Left Knee - Pain      HPI: Patient is a pleasant 86 year old woman with a history of left knee pain.  She is currently working with physical therapy to improve for balance and gait.  Her physical therapist suggested that she might benefit from an injection into the knee.  She had 1 of these last year and it certainly helped  Assessment & Plan: Visit Diagnoses: No diagnosis found.  Plan: We will go forward and inject her knee today.  She may follow-up as needed  Follow-Up Instructions: No follow-ups on file.   Ortho Exam  Patient is alert, oriented, no adenopathy, well-dressed, normal affect, normal respiratory effort. Left knee. No effusion. Pain and crepitus with Range of motion. No erythema. Previous radiographs consistent with arthritis  Imaging: No results found. No images are attached to the encounter.  Labs: Lab Results  Component Value Date   HGBA1C 5.1 08/31/2018   REPTSTATUS 09/05/2018 FINAL 09/01/2018   GRAMSTAIN  09/01/2018    WBC PRESENT, PREDOMINANTLY PMN NO ORGANISMS SEEN CYTOSPIN SMEAR    CULT  09/01/2018    NO GROWTH 3 DAYS Performed at [facility], [address], [place], [place] [address]      Lab Results  Component Value Date   ALBUMIN 2.6 (L) 08/28/2018    Lab Results  Component Value Date   MG 1.9 08/28/2018   No results found for: VD25OH  No results found for: PREALBUMIN CBC EXTENDED Latest Ref Rng & Units 08/31/2018 08/29/2018 08/28/2018  WBC 4.0 - 10.5 K/uL 6.9 7.9 8.0  RBC 3.87 - 5.11 MIL/uL 2.81(L) 2.77(L) 3.19(L)  HGB 12.0 - 15.0 g/dL 2.4(O) 9.7(D) 5.3(G)  HCT 36.0 - 46.0 % 26.8(L) 26.5(L) 31.5(L)  PLT 150 - 400 K/uL  206 204 227  NEUTROABS 1.7 - 7.7 K/uL 4.0 - -  LYMPHSABS 0.7 - 4.0 K/uL 1.6 - -     Body mass index is 32.76 kg/m.  Orders:  No orders of the defined types were placed in this encounter.  No orders of the defined types were placed in this encounter.    Procedures: Large Joint Inj: L knee on 04/03/2019 10:27 AM Indications: pain and diagnostic evaluation Details: 22 G 1.5 in needle, anterolateral approach  Arthrogram: No  Medications: 40 mg methylPREDNISolone acetate 40 MG/ML; 1 mL lidocaine 1 % Outcome: tolerated well, no immediate complications Procedure, treatment alternatives, risks and benefits explained, specific risks discussed. Consent was given by the patient. Immediately prior to procedure a time out was called to verify the correct patient, procedure, equipment, support staff and site/side marked as required. Patient was prepped and draped in the usual sterile fashion.      Clinical Data: No additional findings.  ROS:  All other systems negative, except as noted in the HPI. Review of Systems  Objective: Vital Signs: Ht 5' 2.5" (1.588 m)   Wt 182 lb (82.6 kg)   BMI 32.76 kg/m   Specialty Comments:  No specialty comments available.  PMFS History: Patient Active  Problem List   Diagnosis Date Noted  . Cellulitis of leg, right 08/28/2018  . Diarrhea 08/28/2018  . Opacity of lung on imaging study 08/28/2018  . Anemia of chronic disease 08/28/2018  . Chronic diastolic CHF (congestive heart failure) ([place]) 08/28/2018  . Essential hypertension 08/28/2018  . CAD (coronary artery disease) 08/28/2018  . Diabetes mellitus type 2, diet-controlled ([place]) 08/28/2018  . CKD (chronic kidney disease) stage 3, GFR 30-59 ml/min 08/28/2018   Past Medical History:  Diagnosis Date  . Anemia   . CHF (congestive heart failure) ([place])   . Coronary artery disease   . Diabetes mellitus without complication ([place])   . Lymphadenopathy   . Neuropathy   . Renal disorder   .  Stroke [facility])     No family history on file.  Past Surgical History:  Procedure Laterality Date  . CHOLECYSTECTOMY    . TONSILLECTOMY    . TOTAL HIP ARTHROPLASTY     Social History   Occupational History  . Occupation: retired  Tobacco Use  . Smoking status: Never Smoker  . Smokeless tobacco: Never Used  Substance and Sexual Activity  . Alcohol use: Not Currently    Comment: rare  . Drug use: Never  . Sexual activity: Not on file

## 2019-04-04 ENCOUNTER — Ambulatory Visit
Admission: RE | Admit: 2019-04-04 | Discharge: 2019-04-04 | Disposition: A | Discharge status: Home / Self Care | Payer: Medicare Other | Source: Ambulatory Visit | Attending: Otolaryngology | Admitting: Otolaryngology

## 2019-04-04 DIAGNOSIS — R42 Dizziness and giddiness: Secondary | ICD-10-CM

## 2019-04-04 MED ORDER — GADOBUTROL 1 MMOL/ML IV SOLN
8.0000 mL | Freq: Once | INTRAVENOUS | Status: AC | PRN
  Administered 2019-04-04: 8 mL via INTRAVENOUS

## 2019-04-10 ENCOUNTER — Other Ambulatory Visit: Payer: Self-pay

## 2019-04-10 ENCOUNTER — Ambulatory Visit: Payer: Medicare Other

## 2019-04-10 DIAGNOSIS — R2681 Unsteadiness on feet: Secondary | ICD-10-CM

## 2019-04-10 DIAGNOSIS — M6281 Muscle weakness (generalized): Secondary | ICD-10-CM

## 2019-04-10 NOTE — Therapy (Signed)
[place] MAIN [facility] SERVICES [address] [place], [place], [address] Phone: [phone]   Fax:  [phone]  Physical Therapy Treatment  Patient Details  Name: Gail Page MRN: [id number] Date of Birth: May 21, 1930 Referring Provider (PT): Dr. Carloyn Manner   Encounter Date: 04/10/2019  PT End of Session - 04/10/19 0806    Visit Number  27    Number of Visits  59    Date for PT Re-Evaluation  05/19/19    Authorization Type  eval: 11/09/18,    PT Start Time  0800    PT Stop Time  0845    PT Time Calculation (min)  45 min    Equipment Utilized During Treatment  Gait belt    Activity Tolerance  Patient tolerated treatment well;No increased pain    Behavior During Therapy  WFL for tasks assessed/performed       Past Medical History:  Diagnosis Date  . Anemia   . CHF (congestive heart failure) ([place])   . Coronary artery disease   . Diabetes mellitus without complication ([place])   . Lymphadenopathy   . Neuropathy   . Renal disorder   . Stroke [facility])     Past Surgical History:  Procedure Laterality Date  . CHOLECYSTECTOMY    . TONSILLECTOMY    . TOTAL HIP ARTHROPLASTY      There were no vitals filed for this visit.  Subjective Assessment - 04/10/19 0805    Subjective  Patient reports doing well. She got an injection in her L knee last week which has helped a lot with her pain and she states it is only "0.5/10" today upon arrival. No specific questions or concerns.    Patient is accompained by:  Family member    Pertinent History  Pt complains of unsteadiness/dizziness that initially began after a CVA in 1981, intensified after a MI in 2017, and that she feels she has become even more unsteady recently.  She decribes it is a lightheaded feeling, stating that she feels it all the time, but it fluctuates.  She states that her symptoms are worse when she hasn't eaten or after Lasix.  She reports that she has had BPPV before, but that  she recently was evaluated by an ENT, stating that she did not have BPPV during her evaluation.  She denies any spinning sensations.  She reports 1 bad fall recently that lead to a hospitalization, and since then, she has completed some HH PT and moved in with her daughter due to safety concerns.    Limitations  Standing;Walking;House hold activities;Lifting    Currently in Pain?  Yes    Pain Score  --   "0.5"   Pain Location  Knee    Pain Orientation  Left    Pain Descriptors / Indicators  Aching    Pain Type  Chronic pain    Pain Onset  More than a month ago           TREATMENT   Therapeutic Exercise NuStep L2-4x 28mnutes for warm-up during history,332mutes unbilled;Vitals monitored intermittently, no seated rest breaks. SpO2remainsgreater than or equal to90% throughout; Matrix resisted forward gait with rollator 7.5# x 2, increased to 12.5# and performed an addition 3 reps; Attempted Precor leg press today however pt reports increase in bilateral knee pain so discontinued; Seated marches with5# ankle weight (AW) x 20bilateral; Seated LAQ with5# AW x 20 RLE,too painful to perform on LLE; Seated clams withgreen tbandx20bilateral; Seated adductorisometric squeeze withball,  3s holdx 20bilateral; Seated HS curls withgreentband resistance x20bilateral;  Standing with5# ankle weights: Standingbilateralheel raises with BUE handheld support x20; Standing hip flexion marching x 20 bilateral; Standing hip abduction x 20 bilaterally;  Standing hip extension x 20 bilaterally;   Standing mini squat with bilateral hand held support x 20;    Neuromuscular Re-education Forward and backward ambulation in // bars without UE support x 4 lengths each; Side stepping in // bars without UE support x 2 lengths each; 1/2 foam roll balance without UE support x 30s; 1/2 foam roll heel/toe weight shifts without UE support x 30s; 6" Airex alternating step taps x  10 bilateral; Airex NBOS ball passes around body with head/eye follow and therapist adjusting ball height between waist and overhead as well as adjusting the distance ball is from body x multiple bouts to each side;   Pt educated throughout session about proper posture and technique with exercises. Improved exercise technique, movement at target joints, use of target muscles after min to mod verbal, visual, tactile cues.   Ptcontinues todemonstrategood motivation throughout today's session.She continues to report no further episodes of vertigo. She is able to continue exercises today without issue and requires less cues with each session. Increased ankle weights today and pt is able to complete all exercises as instructed. Introduced resisted gait into session today. Pt discussed discharge with her daughter and her daughter would like for her to continue through the end of the month. Therapist is in agreement.  Balance and strength exercises modified to minimize L knee pain.Pt encouraged to continue HEP and follow up as scheduled.Pt will benefit from PT services to address deficits in strength, balance, and mobility in order to return to full function at home.                       PT Short Term Goals - 02/24/19 1018      PT SHORT TERM GOAL #1   Title  Pt will be independent with HEP in order to improve strength and balance in order to decrease fall risk and improve function at home.    Time  6    Period  Weeks    Status  Achieved    Target Date  12/21/18        PT Long Term Goals - 02/24/19 1018      PT LONG TERM GOAL #1   Title  Pt will improve BERG to >45 in order to decrease her fall risk    Baseline  11/09/18: 29/56; 01/04/19: 40/56; 02/24/19: 41/56    Time  12    Period  Weeks    Status  Partially Met    Target Date  05/19/19      PT LONG TERM GOAL #2   Title  Pt will decrease 5TSTS to <14.8s in order to met normative values for her demographic age  range.    Baseline  11/09/18: 29.15s With intermittent use of the wall for steadying and CGA throughout due to unsteadiness (performed in a chair without armrests); 01/04/19: 21.6s one LOB and CGA throughout; 02/24/19: 24.2s one LOB, CGA throughout;    Time  12    Period  Weeks    Status  Partially Met    Target Date  05/19/19      PT LONG TERM GOAL #3   Title  Pt will decrease TUG to below 14 seconds/decrease in order to demonstrate decreased fall risk.    Baseline  11/09/2018: 14.22s with a rollator; 01/04/19: 14.0s with a rollator; 02/24/19: 16.5s with rollator    Time  12    Period  Weeks    Status  Partially Met    Target Date  05/19/19      PT LONG TERM GOAL #4   Title  Pt will decrease DHI score by at least 18 points in order to demonstrate clinically significant reduction in disability    Baseline  11/09/18: 70/100; 01/04/19: 66/100    Time  12    Period  Weeks    Status  Deferred    Target Date  05/19/19            Plan - 04/10/19 0806    Clinical Impression Statement  Pt continues to demonstrate good motivation throughout today's session. She continues to report no further episodes of vertigo. She is able to continue exercises today without issue and requires less cues with each session. Increased ankle weights today and pt is able to complete all exercises as instructed. Introduced resisted gait into session today. Pt discussed discharge with her daughter and her daughter would like for her to continue through the end of the month. Therapist is in agreement.  Balance and strength exercises modified to minimize L knee pain. Pt encouraged to continue HEP and follow up as scheduled. Pt will benefit from PT services to address deficits in strength, balance, and mobility in order to return to full function at home.    Personal Factors and Comorbidities  Age;Behavior Pattern;Comorbidity 3+;Past/Current Experience;Time since onset of injury/illness/exacerbation;Fitness     Comorbidities  hx of CVA, hx of MI, DM, CHF, Anemia, neuropathy, CAD, lymphedema    Examination-Activity Limitations  Lift;Squat;Caring for Others;Locomotion Level;Stairs;Carry;Stand    Examination-Participation Restrictions  Community Activity    Stability/Clinical Decision Making  Evolving/Moderate complexity    Rehab Potential  Poor    PT Frequency  2x / week    PT Duration  12 weeks    PT Treatment/Interventions  ADLs/Self Care Home Management;Canalith Repostioning;Cryotherapy;Electrical Stimulation;Moist Heat;Traction;Ultrasound;DME Instruction;Gait training;Stair training;Functional mobility training;Therapeutic activities;Therapeutic exercise;Balance training;Neuromuscular re-education;Patient/family education;Manual techniques;Dry needling;Taping;Vestibular;Spinal Manipulations;Joint Manipulations;Energy conservation;Aquatic Therapy;Iontophoresis 47m/ml Dexamethasone    PT Next Visit Plan  Retest for BPPV and treat as appropriate, reassess HEP, progress strength and balance    PT Home Exercise Plan  [id number]    Consulted and Agree with Plan of Care  Patient       Patient will benefit from skilled therapeutic intervention in order to improve the following deficits and impairments:  Abnormal gait, Decreased balance, Decreased endurance, Decreased mobility, Difficulty walking, Dizziness, Decreased activity tolerance, Decreased coordination, Decreased strength  Visit Diagnosis: Unsteadiness on feet  Muscle weakness (generalized)     Problem List Patient Active Problem List   Diagnosis Date Noted  . Cellulitis of leg, right 08/28/2018  . Diarrhea 08/28/2018  . Opacity of lung on imaging study 08/28/2018  . Anemia of chronic disease 08/28/2018  . Chronic diastolic CHF (congestive heart failure) ([place] 08/28/2018  . Essential hypertension 08/28/2018  . CAD (coronary artery disease) 08/28/2018  . Diabetes mellitus type 2, diet-controlled ([place] 08/28/2018  . CKD (chronic kidney  disease) stage 3, GFR 30-59 ml/min 08/28/2018   JPhillips GroutPT, DPT, GCS  Huprich,Jason 04/10/2019, 9:03 AM  [place]MAIN [facility] IowaSERVICES [address] [address]Phone: [phone]  Fax:  [phone] Name: YJeslie LoweMRN: [id number] of Birth: [birth year]

## 2019-04-14 ENCOUNTER — Ambulatory Visit: Payer: Medicare Other

## 2019-04-17 ENCOUNTER — Other Ambulatory Visit: Payer: Self-pay

## 2019-04-17 ENCOUNTER — Ambulatory Visit: Payer: Medicare Other

## 2019-04-17 DIAGNOSIS — R2681 Unsteadiness on feet: Secondary | ICD-10-CM

## 2019-04-17 DIAGNOSIS — M6281 Muscle weakness (generalized): Secondary | ICD-10-CM

## 2019-04-17 NOTE — Therapy (Signed)
[facility] SERVICES [address] [place], [place], [address] Phone: [phone]   Fax:  [phone]  Physical Therapy Treatment/Discharge  Patient Details  Name: Gail Page MRN: [id number] Date of Birth: [birth year] Referring Provider (PT): Dr. Carloyn Manner   Encounter Date: 04/17/2019  PT End of Session - 04/17/19 0809    Visit Number  28    Number of Visits  59    Date for PT Re-Evaluation  05/19/19    Authorization Type  eval: 11/09/18,    PT Start Time  0800    PT Stop Time  0845    PT Time Calculation (min)  45 min    Equipment Utilized During Treatment  Gait belt    Activity Tolerance  Patient tolerated treatment well;No increased pain    Behavior During Therapy  WFL for tasks assessed/performed       Past Medical History:  Diagnosis Date  . Anemia   . CHF (congestive heart failure) ([place])   . Coronary artery disease   . Diabetes mellitus without complication ([place])   . Lymphadenopathy   . Neuropathy   . Renal disorder   . Stroke [facility])     Past Surgical History:  Procedure Laterality Date  . CHOLECYSTECTOMY    . TONSILLECTOMY    . TOTAL HIP ARTHROPLASTY      There were no vitals filed for this visit.  Subjective Assessment - 04/17/19 0808    Subjective  Patient reports doing well. She denies any L knee pain upon arrival. No falls/stumbles since last therapy session. No specific questions or concerns.    Patient is accompained by:  Family member    Pertinent History  Pt complains of unsteadiness/dizziness that initially began after a CVA in 1981, intensified after a MI in 2017, and that she feels she has become even more unsteady recently.  She decribes it is a lightheaded feeling, stating that she feels it all the time, but it fluctuates.  She states that her symptoms are worse when she hasn't eaten or after Lasix.  She reports that she has had BPPV before, but that she recently was evaluated by an ENT,  stating that she did not have BPPV during her evaluation.  She denies any spinning sensations.  She reports 1 bad fall recently that lead to a hospitalization, and since then, she has completed some HH PT and moved in with her daughter due to safety concerns.    Limitations  Standing;Walking;House hold activities;Lifting    Currently in Pain?  No/denies       [facility] PT Assessment - 04/17/19 0814      Assessment   Medical Diagnosis  --    Referring Provider (PT)  --    Onset Date/Surgical Date  --    Hand Dominance  --    Next MD Visit  --    Prior Therapy  --      Precautions   Precautions  --      Home Environment   Living Environment  --    Living Arrangements  --    Type of Home  --    Home Access  --    Entrance Stairs-Number of Steps  --    Entrance Stairs-Rails  --    Home Layout  --    Home Equipment  --      Prior Function   Level of [place]  --  Leisure  --      Cognition   Overall Cognitive Status  --      Observation/Other Assessments   Other Surveys   --    Dizziness Handicap Inventory [facility])   --      Transfers   Five time sit to stand comments   --      Standardized Balance Assessment   Standardized Balance Assessment  --    Five times sit to stand comments   --    10 Meter Walk  --      Berg Balance Test   Sit to Stand  Able to stand without using hands and stabilize independently    Standing Unsupported  Able to stand safely 2 minutes    Sitting with Back Unsupported but Feet Supported on Floor or Stool  Able to sit safely and securely 2 minutes    Stand to Sit  Sits safely with minimal use of hands    Transfers  Able to transfer safely, definite need of hands    Standing Unsupported with Eyes Closed  Able to stand 10 seconds safely    Standing Unsupported with Feet Together  Able to place feet together independently and stand for 1 minute with supervision    From Standing, Reach Forward with Outstretched Arm  Can reach  confidently >25 cm (10")    From Standing Position, Pick up Object from Floor  Able to pick up shoe safely and easily    From Standing Position, Turn to Look Behind Over each Shoulder  Looks behind from both sides and weight shifts well    Turn 360 Degrees  Able to turn 360 degrees safely in 4 seconds or less    Standing Unsupported, Alternately Place Feet on Step/Stool  Able to complete 4 steps without aid or supervision    Standing Unsupported, One Foot in Front  Able to plae foot ahead of the other independently and hold 30 seconds    Standing on One Leg  Tries to lift leg/unable to hold 3 seconds but remains standing independently    Total Score  48      Timed Up and Go Test   TUG  --    Normal TUG (seconds)  --    TUG Comments  --          TREATMENT   Therapeutic Exercise  NuStep L2/3x 326mnutes for warm-up during history,233mutes unbilled;Vitals checked at end, no seated rest breaks.SpO2remainsgreater than90%, HR WNL;  Updated outcome measures and goals with patient (see below)    Results Comments  BERG 48/56 (41/56) (40/56) Fall risk but improved from previous testing  TUG  14.8s (16.5s) (14.0s) Fall risk, slow turns with rollator when returning to chair  5TSTS 14.8s (24.2s) Improved from previous session  10 Meter Gait Speed Self-selected:12.5s=0.26m50m Fastest:10.1s = 0.99 m/swith rollator  Self-selected:13.1=0.426m73mFastest:11.1s = 0.26m/s105mh rollator (Self-selected:12.2sself=0.38m/s16mstest:10.2s =0.926m/sw41mrollator) Below normative values for full community ambulation      Seated clams withgreen tbandx20bilateral; Seated adductorisometric squeeze withball, 3s holdx 20bilateral; Seated marches with green tband x 20 bilateral;  Standing heel raises with BUE support x 10;   Neuromuscular Re-education  Feet together eyes closed x 30s; Semitandem alternating forward LE x 30s each; Airex feet together x  30s;    Ptcontinues todemonstrategood motivation throughout today's session.Today is her last session so outcome measures and goals updated. Her BERG has improved significantly increasing from 41/56 in January to 48/56 today. Her TUG and 52m gai71meed  is roughly unchanged. However her 5TSTS dropped significantly from 24.2s in January to 14.8s. At this time pt has met most of her goals and she is independent with her HEP. She denies any further bouts of vertigo. Updated HEP with patient to include more challenging exercises. Pt provided new resistance bands today.  Pt will be discharged on this date. Encouraged pt to return for additional therapy if she declines with respect to her balance and strength.                        PT Education - 04/17/19 0910    Education Details  Discharge, HEP    Person(s) Educated  Patient    Methods  Explanation;Verbal cues;Handout;Tactile cues    Comprehension  Verbalized understanding;Returned demonstration       PT Short Term Goals - 04/17/19 0810      PT SHORT TERM GOAL #1   Title  Pt will be independent with HEP in order to improve strength and balance in order to decrease fall risk and improve function at home.    Time  6    Period  Weeks    Status  Achieved    Target Date  --        PT Long Term Goals - 04/17/19 0810      PT LONG TERM GOAL #1   Title  Pt will improve BERG to >45 in order to decrease her fall risk    Baseline  11/09/18: 29/56; 01/04/19: 40/56; 02/24/19: 41/56; 04/17/19: 48/56    Time  12    Period  Weeks    Status  Achieved      PT LONG TERM GOAL #2   Title  Pt will decrease 5TSTS to <14.8s in order to met normative values for her demographic age range.    Baseline  11/09/18: 29.15s With intermittent use of the wall for steadying and CGA throughout due to unsteadiness (performed in a chair without armrests); 01/04/19: 21.6s one LOB and CGA throughout; 02/24/19: 24.2s one LOB, CGA throughout; 04/17/19:  14.75s    Time  12    Period  Weeks    Status  Achieved      PT LONG TERM GOAL #3   Title  Pt will decrease TUG to below 14 seconds/decrease in order to demonstrate decreased fall risk.    Baseline  11/09/2018: 14.22s with a rollator; 01/04/19: 14.0s with a rollator; 02/24/19: 16.5s with rollator; 04/17/19: 14.8s    Time  12    Period  Weeks    Status  Partially Met    Target Date  05/19/19      PT LONG TERM GOAL #4   Title  Pt will decrease DHI score by at least 18 points in order to demonstrate clinically significant reduction in disability    Baseline  11/09/18: 70/100; 01/04/19: 66/100    Time  12    Period  Weeks    Status  Deferred            Plan - 04/17/19 0809    Clinical Impression Statement  Pt continues to demonstrate good motivation throughout today's session. Today is her last session so outcome measures and goals updated. Her BERG has improved significantly increasing from 41/56 in January to 48/56 today. Her TUG and 30mgait speed is roughly unchanged. However her 5TSTS dropped significantly from 24.2s in January to 14.8s. At this time pt has met most of her goals and she  is independent with her HEP. She denies any further bouts of vertigo. Updated HEP with patient to include more challenging exercises. Pt provided new resistance bands today.  Pt will be discharged on this date. Encouraged pt to return for additional therapy if she declines with respect to her balance and strength.    Personal Factors and Comorbidities  Age;Behavior Pattern;Comorbidity 3+;Past/Current Experience;Time since onset of injury/illness/exacerbation;Fitness    Comorbidities  hx of CVA, hx of MI, DM, CHF, Anemia, neuropathy, CAD, lymphedema    Examination-Activity Limitations  Lift;Squat;Caring for Others;Locomotion Level;Stairs;Carry;Stand    Examination-Participation Restrictions  Community Activity    Stability/Clinical Decision Making  Evolving/Moderate complexity    Rehab Potential  Poor     PT Frequency  2x / week    PT Duration  12 weeks    PT Treatment/Interventions  ADLs/Self Care Home Management;Canalith Repostioning;Cryotherapy;Electrical Stimulation;Moist Heat;Traction;Ultrasound;DME Instruction;Gait training;Stair training;Functional mobility training;Therapeutic activities;Therapeutic exercise;Balance training;Neuromuscular re-education;Patient/family education;Manual techniques;Dry needling;Taping;Vestibular;Spinal Manipulations;Joint Manipulations;Energy conservation;Aquatic Therapy;Iontophoresis 109m/ml Dexamethasone    PT Next Visit Plan  Discharge    PT Home Exercise Plan  [email or web address]    Consulted and Agree with Plan of Care  Patient       Patient will benefit from skilled therapeutic intervention in order to improve the following deficits and impairments:  Abnormal gait, Decreased balance, Decreased endurance, Decreased mobility, Difficulty walking, Dizziness, Decreased activity tolerance, Decreased coordination, Decreased strength  Visit Diagnosis: Muscle weakness (generalized)  Unsteadiness on feet     Problem List Patient Active Problem List   Diagnosis Date Noted  . Cellulitis of leg, right 08/28/2018  . Diarrhea 08/28/2018  . Opacity of lung on imaging study 08/28/2018  . Anemia of chronic disease 08/28/2018  . Chronic diastolic CHF ([place]estive [place] failure[place] 08/28/[place]  . Essential hypertension 08/28/2018  . CAD (coronary artery disease) 08/28/2018  . Diabetes mellitus type 2, diet-controlled ([place] 08/28/2018  . CKD (chronic kidney disease) stage 3, GFR 30-59 ml/min 08/28/2018   JPhillips GroutPT, DPT, GCS  Lacole Komorowski 04/17/2019, 9:12 AM  [facility] [address] [place] [address]Phone: [phone]  Fax:  [phone] Name: Gail HooleyMRN: [id number] of Birth: [birth year]

## 2019-04-17 NOTE — Patient Instructions (Signed)
Access Code: [email or web address] URL: [email or web address] Date: 04/17/2019 Prepared by: Ria Comment  Exercises Seated Hip Abduction with Resistance - 1 x daily - 7 x weekly - 20 reps - 2 sets - 3s hold Seated Hip Adduction Isometrics with Ball - 1 x daily - 7 x weekly - 20 reps - 2 sets - 3s hold Seated March with Resistance - 1 x daily - 7 x weekly - 20 reps - 2 sets - 3s hold Sit to Stand without Arm Support - 1 x daily - 7 x weekly - 10 reps - 1 sets Heel rises with counter support - 1 x daily - 7 x weekly - 20 reps - 2 sets - 3s hold Romberg Stance with Eyes Closed - 1 x daily - 7 x weekly - 3 sets - 30s hold Standing Romberg to 1/2 Tandem Stance - 1 x daily - 7 x weekly - 3 reps - 30s x 3 with each foot forward hold Romberg Stance on Foam Pad - 1 x daily - 7 x weekly - 3 reps - 30s hold

## 2019-06-18 ENCOUNTER — Other Ambulatory Visit: Payer: Self-pay

## 2019-06-18 ENCOUNTER — Emergency Department (HOSPITAL_COMMUNITY)
Admission: EM | Admit: 2019-06-18 | Discharge: 2019-06-18 | Disposition: A | Discharge status: Home / Self Care | Payer: Medicare Other | Attending: Emergency Medicine | Admitting: Emergency Medicine

## 2019-06-18 ENCOUNTER — Emergency Department (HOSPITAL_COMMUNITY): Payer: Medicare Other

## 2019-06-18 ENCOUNTER — Encounter (HOSPITAL_COMMUNITY): Payer: Self-pay

## 2019-06-18 DIAGNOSIS — I5032 Chronic diastolic (congestive) heart failure: Secondary | ICD-10-CM | POA: Insufficient documentation

## 2019-06-18 DIAGNOSIS — Y9301 Activity, walking, marching and hiking: Secondary | ICD-10-CM | POA: Diagnosis not present

## 2019-06-18 DIAGNOSIS — N183 Chronic kidney disease, stage 3 unspecified: Secondary | ICD-10-CM | POA: Insufficient documentation

## 2019-06-18 DIAGNOSIS — W1830XA Fall on same level, unspecified, initial encounter: Secondary | ICD-10-CM | POA: Insufficient documentation

## 2019-06-18 DIAGNOSIS — S0101XA Laceration without foreign body of scalp, initial encounter: Secondary | ICD-10-CM | POA: Diagnosis present

## 2019-06-18 DIAGNOSIS — Y92013 Bedroom of single-family (private) house as the place of occurrence of the external cause: Secondary | ICD-10-CM | POA: Diagnosis not present

## 2019-06-18 DIAGNOSIS — E1122 Type 2 diabetes mellitus with diabetic chronic kidney disease: Secondary | ICD-10-CM | POA: Diagnosis not present

## 2019-06-18 DIAGNOSIS — I2510 Atherosclerotic heart disease of native coronary artery without angina pectoris: Secondary | ICD-10-CM | POA: Insufficient documentation

## 2019-06-18 DIAGNOSIS — Y999 Unspecified external cause status: Secondary | ICD-10-CM | POA: Insufficient documentation

## 2019-06-18 DIAGNOSIS — W19XXXA Unspecified fall, initial encounter: Secondary | ICD-10-CM

## 2019-06-18 DIAGNOSIS — I130 Hypertensive heart and chronic kidney disease with heart failure and stage 1 through stage 4 chronic kidney disease, or unspecified chronic kidney disease: Secondary | ICD-10-CM | POA: Insufficient documentation

## 2019-06-18 DIAGNOSIS — Z79899 Other long term (current) drug therapy: Secondary | ICD-10-CM | POA: Insufficient documentation

## 2019-06-18 DIAGNOSIS — Z7901 Long term (current) use of anticoagulants: Secondary | ICD-10-CM | POA: Diagnosis not present

## 2019-06-18 MED ORDER — LIDOCAINE-EPINEPHRINE-TETRACAINE (LET) TOPICAL GEL
3.0000 mL | Freq: Once | TOPICAL | Status: AC
  Administered 2019-06-18: 3 mL via TOPICAL
  Filled 2019-06-18: qty 3

## 2019-06-18 NOTE — ED Notes (Signed)
Patient transported to CT 

## 2019-06-18 NOTE — ED Triage Notes (Signed)
Pt reports that she was walking in her room and her L leg went out from under her, hit her head LOC, hematoma to the back of head, on Plavix.

## 2019-06-18 NOTE — ED Provider Notes (Signed)
[facility] EMERGENCY DEPARTMENT Provider Note   CSN: [id number] Arrival date & time: 06/18/19  0009     History Chief Complaint  Patient presents with  . Fall    Gail Page is a 86 y.o. female.  The history is provided by the patient.  Fall This is a recurrent problem. The current episode started less than 1 hour ago. The problem occurs constantly. The problem has not changed since onset.Pertinent negatives include no chest pain, no abdominal pain, no headaches and no shortness of breath. Nothing aggravates the symptoms. Nothing relieves the symptoms. She has tried nothing for the symptoms. The treatment provided no relief.  Walking and tripped.  Hit back of head on ground.     Past Medical History:  Diagnosis Date  . Anemia   . CHF (congestive heart failure) (HCC)   . Coronary artery disease   . Diabetes mellitus without complication (HCC)   . Lymphadenopathy   . Neuropathy   . Renal disorder   . Stroke [facility])     Patient Active Problem List   Diagnosis Date Noted  . Cellulitis of leg, right 08/28/2018  . Diarrhea 08/28/2018  . Opacity of lung on imaging study 08/28/2018  . Anemia of chronic disease 08/28/2018  . Chronic diastolic CHF (congestive heart failure) (HCC) 08/28/2018  . Essential hypertension 08/28/2018  . CAD (coronary artery disease) 08/28/2018  . Diabetes mellitus type 2, diet-controlled (HCC) 08/28/2018  . CKD (chronic kidney disease) stage 3, GFR 30-59 ml/min 08/28/2018    Past Surgical History:  Procedure Laterality Date  . CHOLECYSTECTOMY    . TONSILLECTOMY    . TOTAL HIP ARTHROPLASTY       OB History   No obstetric history on file.     History reviewed. No pertinent family history.  Social History   Tobacco Use  . Smoking status: Never Smoker  . Smokeless tobacco: Never Used  Substance Use Topics  . Alcohol use: Not Currently    Comment: rare  . Drug use: Never    Home Medications Prior to Admission  medications   Medication Sig Start Date End Date Taking? Authorizing Provider  acetaminophen (TYLENOL) 500 MG tablet Take 500-1,000 mg by mouth every 6 (six) hours as needed for mild pain or moderate pain.    [provider]  atorvastatin (LIPITOR) 40 MG tablet Take 40 mg by mouth at bedtime. 06/21/18   [provider]  b complex vitamins tablet Take 1 tablet by mouth daily.    [provider]  calcium-vitamin D (OSCAL WITH D) 500-200 MG-UNIT tablet Take 1 tablet by mouth every morning.    [provider]  cholecalciferol (VITAMIN D) 25 MCG (1000 UT) tablet Take 1,000 Units by mouth daily.    [provider]  clopidogrel (PLAVIX) 75 MG tablet Take 75 mg by mouth daily.    [provider]  diclofenac sodium (VOLTAREN) 1 % GEL Apply 4 g topically 4 (four) times daily. 09/02/18   Narda Bonds, MD  doxycycline (VIBRAMYCIN) 100 MG capsule Take one cap PO Q12 hours with food. 09/10/18   Lattie Haw, MD  ferrous sulfate 300 (60 Fe) MG/5ML syrup Take 300 mg by mouth daily.    [provider]  fluticasone (FLONASE) 50 MCG/ACT nasal spray Place 1 spray into both nostrils daily as needed for allergies or rhinitis.    [provider]  furosemide (LASIX) 20 MG tablet Take 20 mg by mouth daily as needed (weight  gain >2).    [provider]  metoprolol succinate (TOPROL-XL) 25 MG 24 hr tablet Take 12.5 mg by mouth daily.    [provider]  vancomycin (VANCOCIN) 125 MG capsule Take 1 capsule (125 mg total) by mouth 4 (four) times daily. 09/02/18   Narda Bonds, MD  vitamin B-12 (CYANOCOBALAMIN) 1000 MCG tablet Take 1,000 mcg by mouth daily.    [provider]    Allergies    Penicillins, Latex, and Pork-derived products  Review of Systems   Review of Systems  Constitutional: Negative for fever.  HENT: Negative for congestion.   Eyes: Negative for visual disturbance.  Respiratory: Negative for shortness  of breath.   Cardiovascular: Negative for chest pain.  Gastrointestinal: Negative for abdominal pain.  Genitourinary: Negative for difficulty urinating.  Musculoskeletal: Negative for arthralgias.  Skin: Positive for wound.  Neurological: Negative for headaches.  Psychiatric/Behavioral: Negative for agitation.  All other systems reviewed and are negative.   Physical Exam Updated Vital Signs BP (!) 152/65 (BP Location: Right Arm)   Pulse 61   Temp (!) 97 F (36.1 C) (Temporal)   Resp 19   Ht 5\' 4"  (1.626 m)   Wt 83.5 kg   SpO2 100%   BMI 31.58 kg/m   Physical Exam Vitals and nursing note reviewed.  Constitutional:      General: She is not in acute distress.    Appearance: Normal appearance.  HENT:     Head: Normocephalic.      Right Ear: Tympanic membrane normal.     Left Ear: Tympanic membrane normal.     Nose: Nose normal.  Eyes:     Pupils: Pupils are equal, round, and reactive to light.  Cardiovascular:     Rate and Rhythm: Normal rate and regular rhythm.     Pulses: Normal pulses.     Heart sounds: Normal heart sounds.  Pulmonary:     Effort: Pulmonary effort is normal.     Breath sounds: Normal breath sounds.  Abdominal:     General: Abdomen is flat. Bowel sounds are normal.     Tenderness: There is no abdominal tenderness.  Musculoskeletal:        General: No tenderness or deformity. Normal range of motion.     Cervical back: Normal range of motion and neck supple.  Skin:    General: Skin is warm and dry.     Capillary Refill: Capillary refill takes less than 2 seconds.  Neurological:     General: No focal deficit present.     Mental Status: She is alert and oriented to person, place, and time.     Deep Tendon Reflexes: Reflexes normal.  Psychiatric:        Mood and Affect: Mood normal.        Behavior: Behavior normal.     ED Results / Procedures / Treatments   Labs (all labs ordered are listed, but only abnormal results are displayed) Labs  Reviewed - No data to display  EKG None  Radiology CT Head Wo Contrast  Result Date: 06/18/2019 CLINICAL DATA:  Fall, posterior hematoma, loss of consciousness, on Plavix EXAM: CT HEAD WITHOUT CONTRAST CT CERVICAL SPINE WITHOUT CONTRAST TECHNIQUE: Multidetector CT imaging of the head and cervical spine was performed following the standard protocol without intravenous contrast. Multiplanar CT image reconstructions of the cervical spine were also generated. COMPARISON:  None. FINDINGS: CT HEAD FINDINGS Brain: No evidence of acute infarction, hemorrhage, hydrocephalus, extra-axial collection or mass  lesion/mass effect. Symmetric prominence of the ventricles, cisterns and sulci compatible with parenchymal volume loss. Patchy areas of white matter hypoattenuation are most compatible with chronic microvascular angiopathy. 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 Next Vascular: Atherosclerotic calcification of the carotid siphons and intradural vertebral arteries. No hyperdense vessel. Skull: Right parietal scalp swelling with trace 3 mm crescentic hematoma and small foci of gas suggesting some laceration. No subjacent calvarial fracture or worrisome osseous lesions. Sinuses/Orbits: Paranasal sinuses and mastoid air cells are predominantly clear. Orbital structures are unremarkable aside from prior lens extractions. Other: Periapical lucency about a left maxillary bicuspid better seen on cervical CT. CT CERVICAL SPINE FINDINGS Alignment: Stabilization collar is absent at the time of examination. There is mild neck flexion and slight rightward cranial rotation. There is straightening of normal cervical lordosis with slight reversal centered at the C3-4 level. No evidence of traumatic listhesis. No abnormally widened, perched or jumped facets. Normal alignment of the craniocervical and atlantoaxial  articulations. Skull base and vertebrae: Mild arthrosis at the atlantodental interval with minimal calcific pannus formation. No acute fracture. No primary bone lesion or focal pathologic process. Soft tissues and spinal canal: No pre or paravertebral fluid or swelling. No visible canal hematoma. Disc levels: Multilevel intervertebral disc height loss with spondylitic endplate changes. Posterior disc osteophyte complexes C3-C6 result in moderate multilevel canal stenosis. No severe canal narrowing. Uncinate spurring and facet hypertrophic changes throughout the cervical and upper thoracic spine result in mild-to-moderate multilevel neural foraminal narrowing as well. Upper chest: No acute abnormality in the upper chest or imaged lung apices. Other: Cervical carotid atherosclerosis. No concerning thyroid nodules. IMPRESSION: 1. Right parietal scalp swelling with trace 3 mm crescentic hematoma and small foci of gas suggesting laceration. No subjacent calvarial fracture or acute intracranial abnormality. 2. Chronic microvascular angiopathy and mild parenchymal volume loss. 3. No evidence of acute fracture or traumatic listhesis of the cervical spine. 4. Moderate multilevel degenerative disc disease and facet arthropathy of the cervical spine, moderate canal stenoses C3-C6 and mild-to-moderate multilevel neural foraminal narrowing. 5. Cervical carotid atherosclerosis. Electronically Signed   By: Kreg ShropshirePrice  DeHay M.D.   On: 06/18/2019 02:09   CT Cervical Spine Wo Contrast  Result Date: 06/18/2019 CLINICAL DATA:  Fall, posterior hematoma, loss of consciousness, on Plavix EXAM: CT HEAD WITHOUT CONTRAST CT CERVICAL SPINE WITHOUT CONTRAST TECHNIQUE: Multidetector CT imaging of the head and cervical spine was performed following the standard protocol without intravenous contrast. Multiplanar CT image reconstructions of the cervical spine were also generated. COMPARISON:  None. FINDINGS: CT HEAD FINDINGS Brain: No evidence of  acute infarction, hemorrhage, hydrocephalus, extra-axial collection or mass lesion/mass effect. Symmetric prominence of the ventricles, cisterns and sulci compatible with parenchymal volume loss. Patchy areas of white matter hypoattenuation are most compatible with chronic microvascular angiopathy. 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 Next Vascular: Atherosclerotic calcification of the carotid siphons and intradural vertebral arteries. No hyperdense vessel. Skull: Right parietal scalp swelling with trace 3 mm crescentic hematoma and small foci of gas suggesting some laceration. No subjacent calvarial fracture or worrisome osseous lesions. Sinuses/Orbits: Paranasal sinuses and mastoid air cells are predominantly clear. Orbital structures are unremarkable aside from prior lens extractions. Other: Periapical lucency about a left maxillary bicuspid better seen on cervical CT. CT CERVICAL SPINE FINDINGS Alignment: Stabilization collar is absent at the  time of examination. There is mild neck flexion and slight rightward cranial rotation. There is straightening of normal cervical lordosis with slight reversal centered at the C3-4 level. No evidence of traumatic listhesis. No abnormally widened, perched or jumped facets. Normal alignment of the craniocervical and atlantoaxial articulations. Skull base and vertebrae: Mild arthrosis at the atlantodental interval with minimal calcific pannus formation. No acute fracture. No primary bone lesion or focal pathologic process. Soft tissues and spinal canal: No pre or paravertebral fluid or swelling. No visible canal hematoma. Disc levels: Multilevel intervertebral disc height loss with spondylitic endplate changes. Posterior disc osteophyte complexes C3-C6 result in moderate multilevel canal stenosis. No severe canal narrowing. Uncinate spurring and facet  hypertrophic changes throughout the cervical and upper thoracic spine result in mild-to-moderate multilevel neural foraminal narrowing as well. Upper chest: No acute abnormality in the upper chest or imaged lung apices. Other: Cervical carotid atherosclerosis. No concerning thyroid nodules. IMPRESSION: 1. Right parietal scalp swelling with trace 3 mm crescentic hematoma and small foci of gas suggesting laceration. No subjacent calvarial fracture or acute intracranial abnormality. 2. Chronic microvascular angiopathy and mild parenchymal volume loss. 3. No evidence of acute fracture or traumatic listhesis of the cervical spine. 4. Moderate multilevel degenerative disc disease and facet arthropathy of the cervical spine, moderate canal stenoses C3-C6 and mild-to-moderate multilevel neural foraminal narrowing. 5. Cervical carotid atherosclerosis. Electronically Signed   By: Lovena Le M.D.   On: 06/18/2019 02:09    Procedures .Marland KitchenLaceration Repair  Date/Time: 06/18/2019 3:13 AM Performed by: Veatrice Kells, MD Authorized by: Veatrice Kells, MD   Consent:    Consent obtained:  Verbal   Consent given by:  Patient   Risks discussed:  Infection, need for additional repair, nerve damage, pain, poor cosmetic result, poor wound healing, retained foreign body, tendon damage and vascular damage   Alternatives discussed:  No treatment Anesthesia (see MAR for exact dosages):    Anesthesia method:  Topical application   Topical anesthetic:  LET Laceration details:    Location:  Scalp   Scalp location:  Occipital   Length (cm):  0.9   Depth (mm):  1 Repair type:    Repair type:  Simple Pre-procedure details:    Preparation:  Patient was prepped and draped in usual sterile fashion Exploration:    Hemostasis achieved with:  Direct pressure   Wound exploration: wound explored through full range of motion     Wound extent: no areolar tissue violation noted     Contaminated: no   Treatment:    Area  cleansed with:  Saline, Shur-Clens and Betadine   Amount of cleaning:  Extensive   Irrigation solution:  Sterile saline   Irrigation method:  Pressure wash Skin repair:    Repair method:  Staples   Number of staples:  1 Approximation:    Approximation:  Close Post-procedure details:    Dressing:  Open (no dressing)   Patient tolerance of procedure:  Tolerated well, no immediate complications   (including critical care time)  Medications Ordered in ED Medications - No data to display  ED Course  I have reviewed the triage vital signs and the nursing notes.  Pertinent labs & imaging results that were available during my care of the patient were reviewed by me and considered in my medical decision making (see chart for details).   Staple removal in 5 days at urgent care.  Wound care instructions given.   Final Clinical Impression(s) / ED Diagnoses Return for intractable  cough, coughing up blood,fevers >100.4 unrelieved by medication, shortness of breath, intractable vomiting, chest pain, shortness of breath, weakness,numbness, changes in speech, facial asymmetry,abdominal pain, passing out,Inability to tolerate liquids or food, cough, altered mental status or any concerns. No signs of systemic illness or infection. The patient is nontoxic-appearing on exam and vital signs are within normal limits.   I have reviewed the triage vital signs and the nursing notes. Pertinent labs &imaging results that were available during my care of the patient were reviewed by me and considered in my medical decision making (see chart for details).After history, exam, and medical workup I feel the patient has beenappropriately medically screened and is safe for discharge home. Pertinent diagnoses were discussed with the patient. Patient was given return precautions.   Angelea Penny, MD 06/18/19 [phone]

## 2019-06-27 ENCOUNTER — Other Ambulatory Visit: Payer: Self-pay

## 2019-06-27 ENCOUNTER — Encounter: Payer: Self-pay | Admitting: Emergency Medicine

## 2019-06-27 ENCOUNTER — Ambulatory Visit
Admission: EM | Admit: 2019-06-27 | Discharge: 2019-06-27 | Disposition: A | Discharge status: Home / Self Care | Payer: Medicare Other

## 2019-06-27 DIAGNOSIS — Z4802 Encounter for removal of sutures: Secondary | ICD-10-CM

## 2019-06-27 NOTE — ED Triage Notes (Signed)
Here to have staple removed from top of head

## 2019-06-27 NOTE — ED Notes (Signed)
Removed one staple from scalp, area free of redness or drainage.

## 2019-07-24 ENCOUNTER — Encounter: Payer: Self-pay | Admitting: Orthopedic Surgery

## 2019-07-24 ENCOUNTER — Telehealth: Payer: Self-pay | Admitting: Orthopedic Surgery

## 2019-07-24 NOTE — Telephone Encounter (Signed)
Patient's daughter called.   They are wanting to purchase some compression socks for her and need to know which level to get.   Call back: [phone]

## 2019-07-24 NOTE — Telephone Encounter (Signed)
Recommend 15 to 20 mmHg knee-high compression stockings

## 2019-07-24 NOTE — Telephone Encounter (Signed)
Please advise 

## 2019-07-25 NOTE — Telephone Encounter (Signed)
Patient's daughter was called and clarified the compression size for her mother's legs. Patient daughter understood.

## 2019-09-28 ENCOUNTER — Telehealth: Payer: Self-pay | Admitting: Orthopedic Surgery

## 2019-09-28 NOTE — Telephone Encounter (Signed)
Patient's daughter called.   She is requesting a call back to establish whether or not the patient's current issue is something Dr.Duda could treat  Call back: [phone]

## 2019-09-28 NOTE — Telephone Encounter (Signed)
I called and sw pt's daughter and she has a leg length discrepancy and wanted to know if Dr. Lajoyce Corners can eval for a lift for her shoe and a DM foot exam also f/u for lymphedema which she has been treated by Dr. Lajoyce Corners for in the past. Advised that yes we can see her for all these things. States that she will call back to sch after talking with mother PCP.

## 2020-05-06 IMAGING — MR MR BRAIN/IAC WO/W CM
9 of 14 series · 26 of 48 positions shown · IV contrast (8ml Gadavist)
Comparison: None.

CLINICAL DATA: Chronic dizziness and vertigo. Multiple falls since
July 2018.

EXAM:
MRI HEAD WITHOUT AND WITH CONTRAST
TECHNIQUE: Multiplanar, multiecho pulse sequences of the brain and surrounding
structures were obtained without and with intravenous contrast.
CONTRAST:  8mL GADAVIST GADOBUTROL 1 MMOL/ML IV SOLN

[Series 5: T1 · sagittal · 5.0mm · 0.62mm/px · 1 of 22 slices shown (1 of 3)]
[im 1/22]
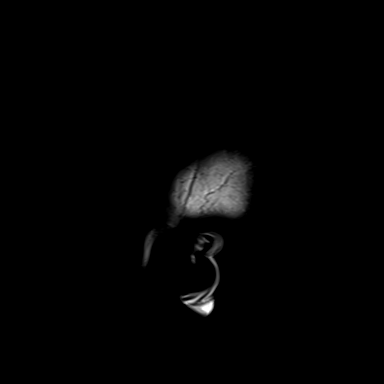

[Series 6: ax dwi_tracew · axial · 3.0mm · 0.60mm/px · z∈[-83,+71]mm · 6 of 96 slices shown]
[im 1/96]
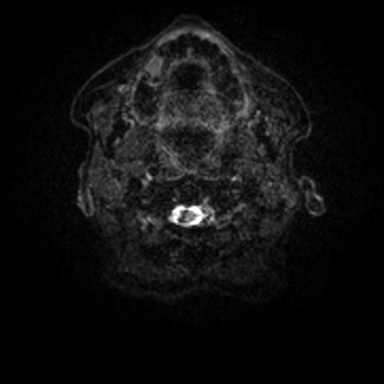
[im 20/96]
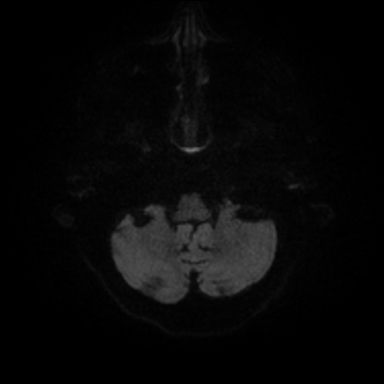
[im 39/96]
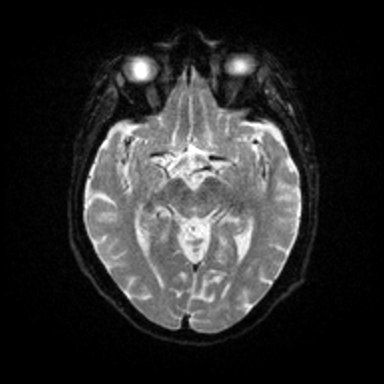
[im 58/96]
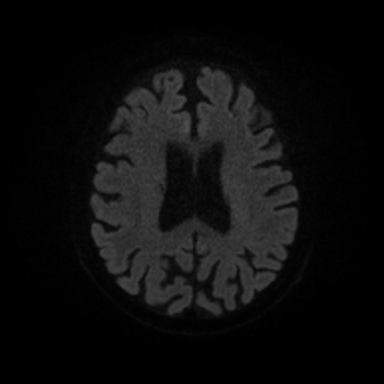
[im 77/96]
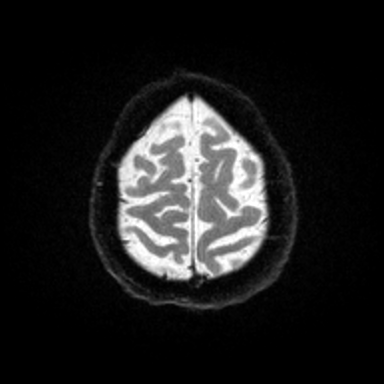
[im 96/96]
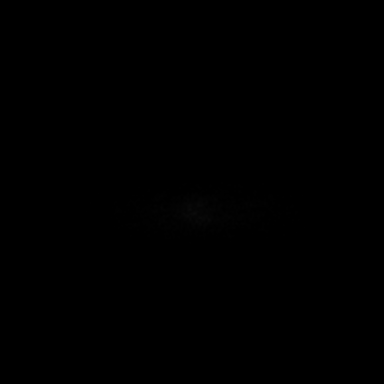

[Series 8: T2 · axial · 5.0mm · 0.53mm/px · z∈[-78,+65]mm · 2 of 25 slices shown]
[im 1/25]
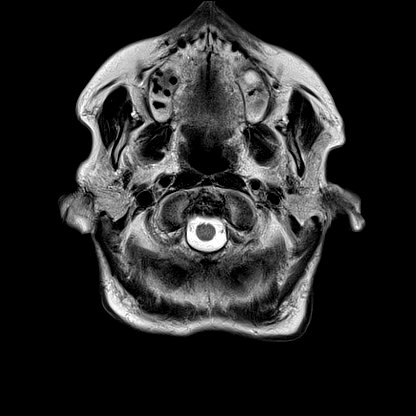
[im 25/25]
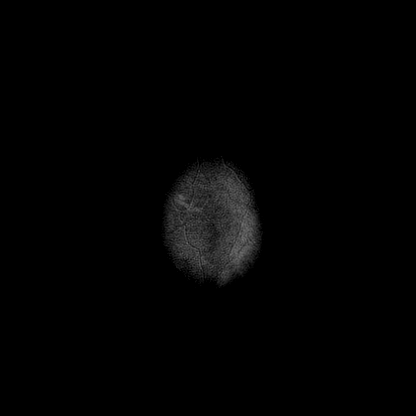

[Series 13: FLAIR · axial · 3.0mm · 0.53mm/px · z∈[-87,+74]mm · 4 of 55 slices shown]
[im 1/55]
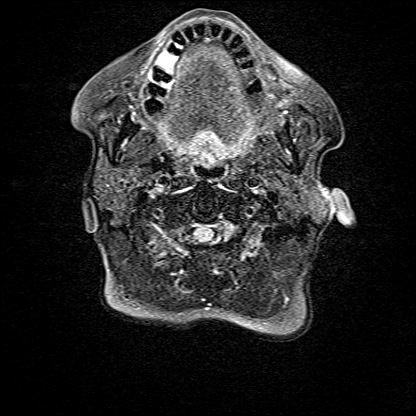
[im 19/55]
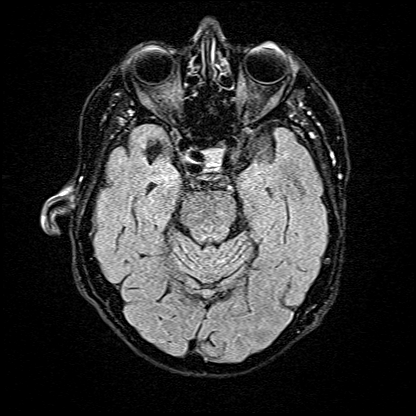
[im 37/55]
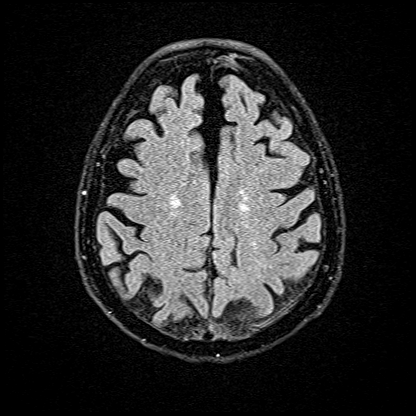
[im 55/55]
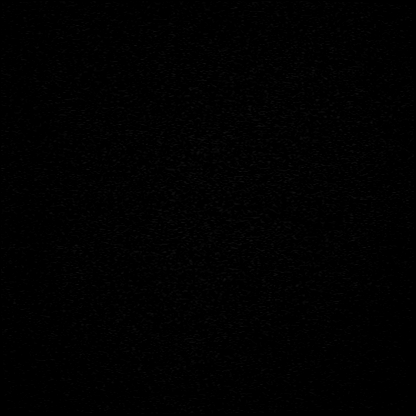

[Series 14: T1 · coronal · non-contrast · 3.0mm · 0.21mm/px · 1 of 13 slices shown (2 of 3)]
[im 1/13]
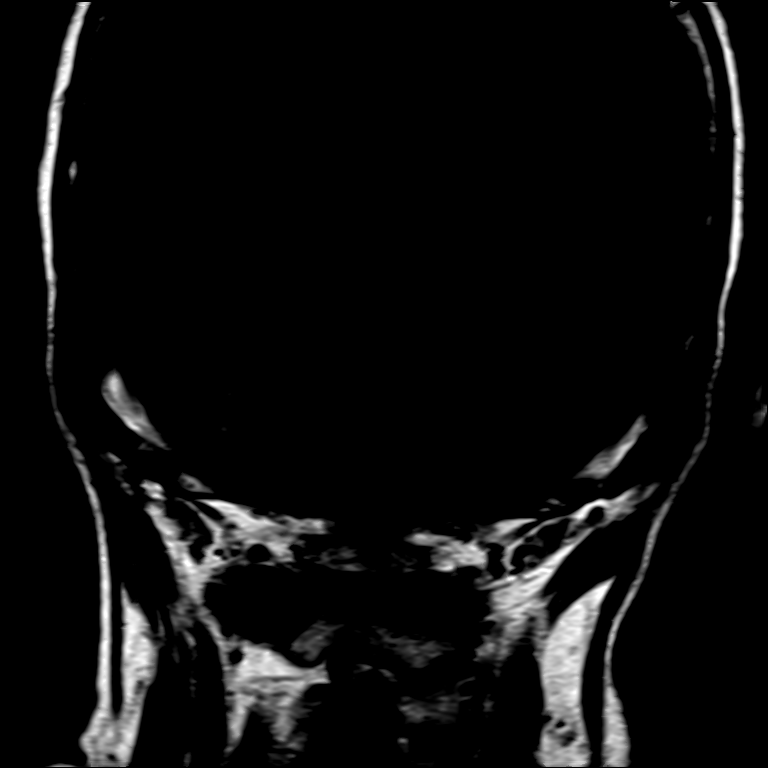

[Series 16: T1 · axial · non-contrast · 3.0mm · 0.21mm/px · 1 of 15 slices shown (3 of 3)]
[im 1/15]
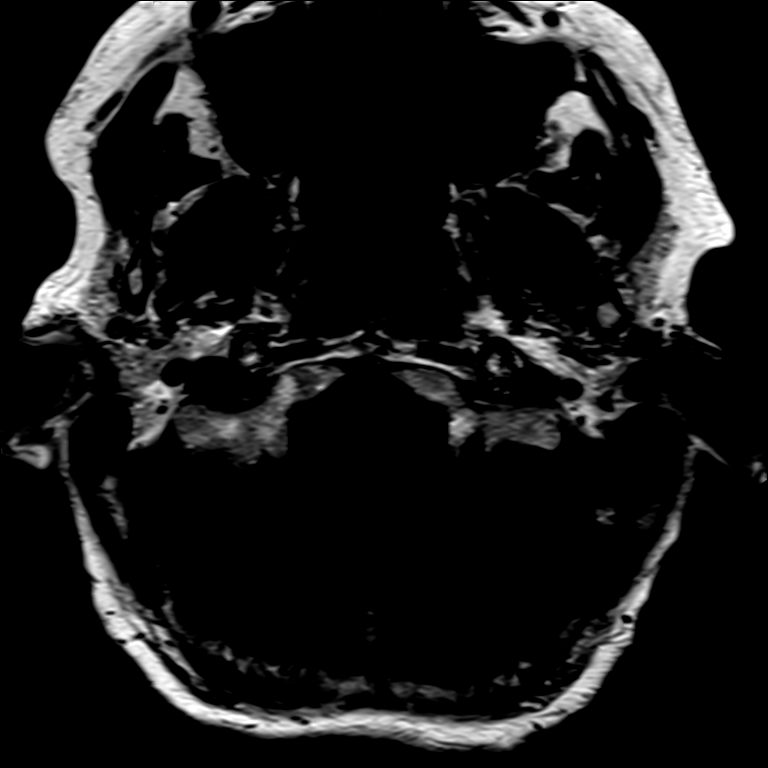

[Series 17: T1 post-contrast · axial · 3.0mm · 0.21mm/px · 1 of 15 slices shown (1 of 3)]
[im 1/15]
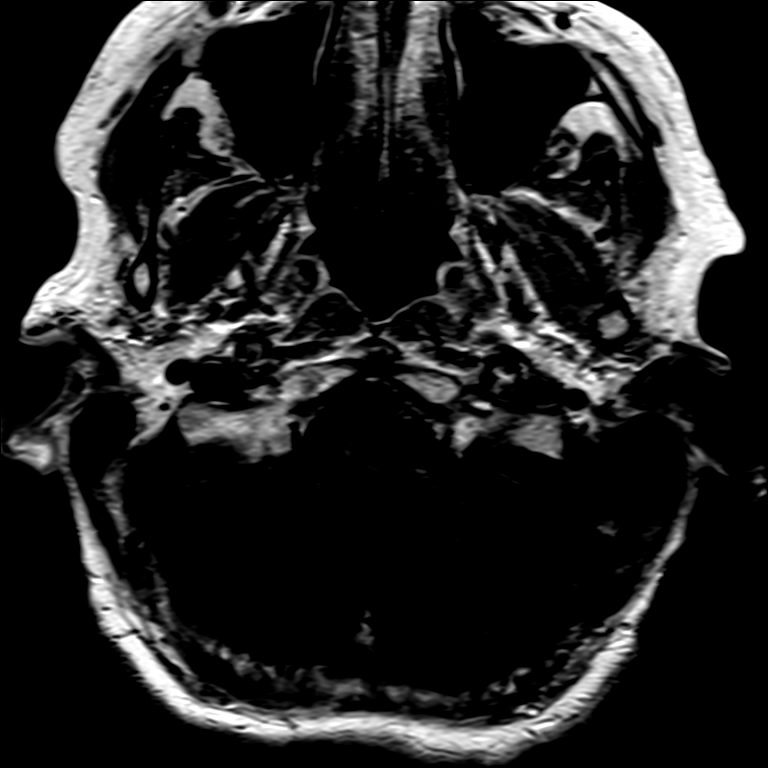

[Series 18: T1 post-contrast · coronal · 3.0mm · 0.21mm/px · 1 of 13 slices shown (2 of 3)]
[im 1/13]
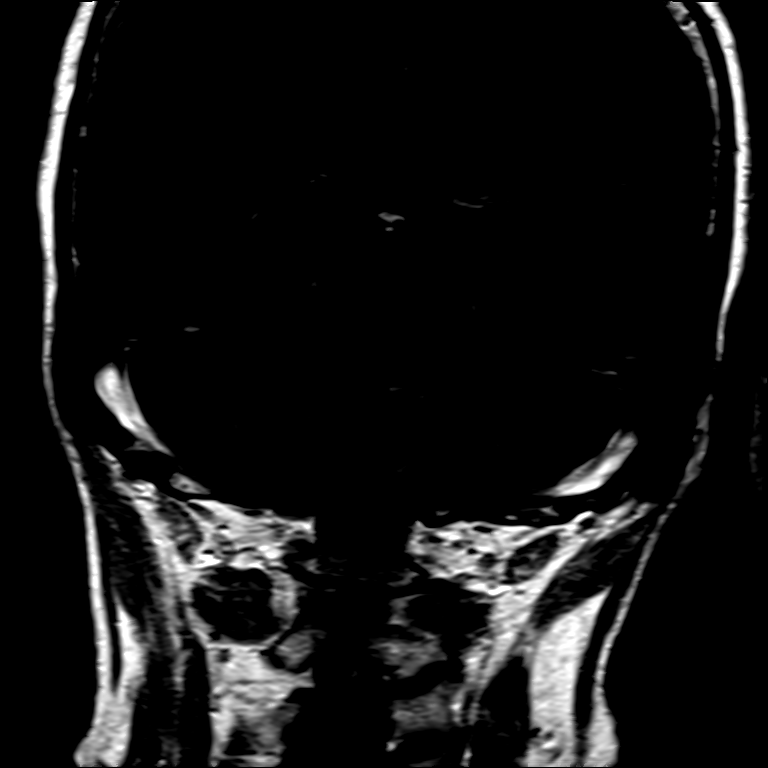

[Series 19: T1 post-contrast · axial · 1.0mm · 0.98mm/px · z∈[-93,+82]mm · 9 of 175 slices shown (3 of 3)]
[im 1/175]
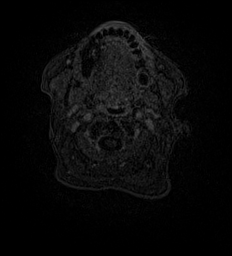
[im 32/175]
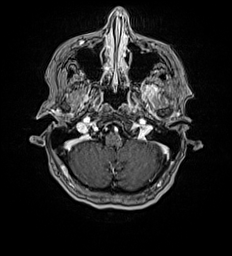
[im 48/175]
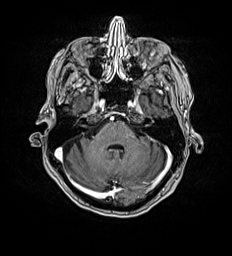
[im 80/175]
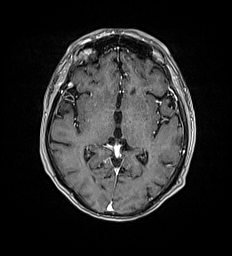
[im 95/175]
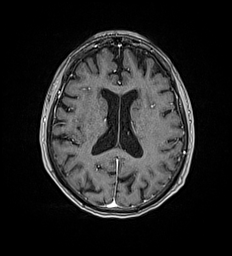
[im 127/175]
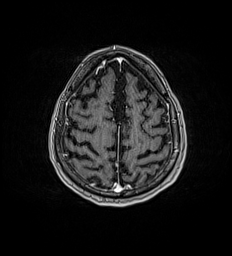
[im 143/175]
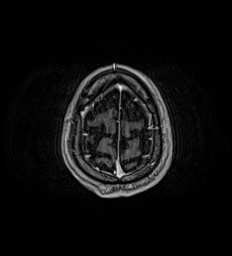
[im 159/175]
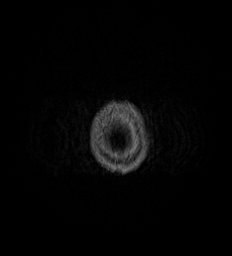
[im 175/175]
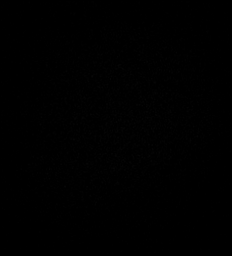

[26 of 48 positions shown; findings below may reference images not displayed]

FINDINGS: Brain: No acute infarction, hemorrhage, hydrocephalus, extra-axial
collection or mass lesion.

Prominence of the ventricular system, cerebral and cerebellar sulci
reflecting parenchymal volume loss.

Scattered foci of T2 hyperintensity are seen within the white matter
of the cerebral hemispheres and within the pons, nonspecific, most
likely related to chronic small vessel ischemia.

No cerebellopontine angle mass or internal auditory canal lesion is
demonstrated.Normal appearance of the 7th and 8th cranial nerves
bilaterally.

No focus of abnormal contrast enhancement.

Vascular: Normal flow voids.

Skull and upper cervical spine: Normal marrow signal.

Sinuses/Orbits: Mucous retention cyst in the left maxillary sinus.
Mild mucosal thickening of the ethmoid cells. Bilateral lens
surgery.

Other: None.
IMPRESSION: 1. No acute intracranial abnormality or abnormal contrast
enhancement.
2. No cerebellopontine angle mass or internal auditory canal lesion.
Normal appearance of the 7th and 8th cranial nerves bilaterally.
3. Moderate parenchymal volume loss and probable chronic small
vessel ischemia.

## 2020-11-11 ENCOUNTER — Encounter: Payer: Self-pay | Admitting: Orthopedic Surgery

## 2020-11-11 ENCOUNTER — Ambulatory Visit (INDEPENDENT_AMBULATORY_CARE_PROVIDER_SITE_OTHER): Payer: Medicare Other | Admitting: Orthopedic Surgery

## 2020-11-11 ENCOUNTER — Ambulatory Visit: Payer: Self-pay

## 2020-11-11 DIAGNOSIS — M25511 Pain in right shoulder: Secondary | ICD-10-CM | POA: Diagnosis not present

## 2020-11-11 DIAGNOSIS — G8929 Other chronic pain: Secondary | ICD-10-CM

## 2020-11-11 DIAGNOSIS — M25512 Pain in left shoulder: Secondary | ICD-10-CM | POA: Diagnosis not present

## 2020-11-11 NOTE — Progress Notes (Signed)
Office Visit Note   Patient: Salle Brandle           Date of Birth: [birth year]           MRN: [id number] Visit Date: 11/11/2020              Requested by: Lauro Regulus, MD [address] [facility] [place],  [place] [address] PCP: Lauro Regulus, MD  Chief Complaint  Patient presents with   Right Shoulder - Pain   Left Shoulder - Pain      HPI: Patient is a 87 year old woman who presents with complaints of bilateral shoulder pain.  She fell on the floor about a week ago and states that she did not get off the floor for about 20 hours she went to the emergency room radiographs of her knee and shoulder showed no fractures patient complains of pain even when lifting a cup to her mouth.  Patient states she has had right shoulder pain since March 2021.  She states this started after a COVID injection.  Assessment & Plan: Visit Diagnoses:  1. Chronic pain of both shoulders     Plan: Both subacromial joint spaces were injected she tolerated this well she will follow-up for evaluation of her knees.  There are x-rays from [facility] in the epic system.  Follow-Up Instructions: Return in about 4 weeks (around 12/09/2020).   Ortho Exam  Patient is alert, oriented, no adenopathy, well-dressed, normal affect, normal respiratory effort. Examination of both shoulders she has painful abduction to 120 degrees bilaterally she has pain with Neer and Hawkins impingement test the [facility] joint is not tender to palpation the biceps tendon is nontender to palpation  Imaging: XR Shoulder Left  Result Date: 11/11/2020 2 view radiographs of the left shoulder shows osteophytic spurring from arthritis the joint is congruent no fractures  XR Shoulder Right  Result Date: 11/11/2020 2 view radiographs of the right shoulder shows osteophytic bone spurs the joint spaces congruent no fractures  No images are attached to the encounter.  Labs: Lab Results  Component Value Date    HGBA1C 5.1 08/31/2018   REPTSTATUS 09/05/2018 FINAL 09/01/2018   GRAMSTAIN  09/01/2018    WBC PRESENT, PREDOMINANTLY PMN NO ORGANISMS SEEN CYTOSPIN SMEAR    CULT  09/01/2018    NO GROWTH 3 DAYS Performed at [facility], [address] [place], [place] [address]      Lab Results  Component Value Date   ALBUMIN 2.6 (L) 08/28/2018    Lab Results  Component Value Date   MG 1.9 08/28/2018   No results found for: VD25OH  No results found for: PREALBUMIN CBC EXTENDED Latest Ref Rng & Units 08/31/2018 08/29/2018 08/28/2018  WBC 4.0 - 10.5 K/uL 6.9 7.9 8.0  RBC 3.87 - 5.11 MIL/uL 2.81(L) 2.77(L) 3.19(L)  HGB 12.0 - 15.0 g/dL 7.4(Y) 8.1(K) 4.8(J)  HCT 36.0 - 46.0 % 26.8(L) 26.5(L) 31.5(L)  PLT 150 - 400 K/uL 206 204 227  NEUTROABS 1.7 - 7.7 K/uL 4.0 - -  LYMPHSABS 0.7 - 4.0 K/uL 1.6 - -     There is no height or weight on file to calculate BMI.  Orders:  Orders Placed This Encounter  Procedures   XR Shoulder Right   XR Shoulder Left   No orders of the defined types were placed in this encounter.    Procedures: Large Joint Inj: bilateral subacromial bursa on 11/11/2020 12:07 PM Indications: diagnostic evaluation and pain  Details: 22 G 1.5 in needle, posterior approach  Arthrogram: No  Outcome: tolerated well, no immediate complications Procedure, treatment alternatives, risks and benefits explained, specific risks discussed. Consent was given by the patient. Immediately prior to procedure a time out was called to verify the correct patient, procedure, equipment, support staff and site/side marked as required. Patient was prepped and draped in the usual sterile fashion.     Clinical Data: No additional findings.  ROS:  All other systems negative, except as noted in the HPI. Review of Systems  Objective: Vital Signs: There were no vitals taken for this visit.  Specialty Comments:  No specialty comments available.  PMFS History: Patient Active Problem  List   Diagnosis Date Noted   Cellulitis of leg, right 08/28/2018   Diarrhea 08/28/2018   Opacity of lung on imaging study 08/28/2018   Anemia of chronic disease 08/28/2018   Chronic diastolic CHF (congestive heart failure) (HCC) 08/28/2018   Essential hypertension 08/28/2018   CAD (coronary artery disease) 08/28/2018   Diabetes mellitus type 2, diet-controlled (HCC) 08/28/2018   CKD (chronic kidney disease) stage 3, GFR 30-59 ml/min (HCC) 08/28/2018   Past Medical History:  Diagnosis Date   Anemia    CHF (congestive heart failure) (HCC)    Coronary artery disease    Diabetes mellitus without complication (HCC)    Lymphadenopathy    Neuropathy    Renal disorder    Stroke [facility])     History reviewed. No pertinent family history.  Past Surgical History:  Procedure Laterality Date   CHOLECYSTECTOMY     TONSILLECTOMY     TOTAL HIP ARTHROPLASTY     Social History   Occupational History   Occupation: retired  Tobacco Use   Smoking status: Never   Smokeless tobacco: Never  Substance and Sexual Activity   Alcohol use: Not Currently    Comment: rare   Drug use: Never   Sexual activity: Not on file

## 2020-11-18 ENCOUNTER — Ambulatory Visit: Payer: Medicare Other | Admitting: Orthopedic Surgery

## 2020-11-25 ENCOUNTER — Other Ambulatory Visit: Payer: Self-pay

## 2020-11-25 ENCOUNTER — Ambulatory Visit (INDEPENDENT_AMBULATORY_CARE_PROVIDER_SITE_OTHER): Payer: Medicare Other | Admitting: Orthopedic Surgery

## 2020-11-25 ENCOUNTER — Encounter: Payer: Self-pay | Admitting: Orthopedic Surgery

## 2020-11-25 ENCOUNTER — Ambulatory Visit (INDEPENDENT_AMBULATORY_CARE_PROVIDER_SITE_OTHER): Payer: Medicare Other

## 2020-11-25 DIAGNOSIS — M25511 Pain in right shoulder: Secondary | ICD-10-CM | POA: Diagnosis not present

## 2020-11-25 DIAGNOSIS — G8929 Other chronic pain: Secondary | ICD-10-CM | POA: Diagnosis not present

## 2020-11-25 DIAGNOSIS — M1712 Unilateral primary osteoarthritis, left knee: Secondary | ICD-10-CM | POA: Diagnosis not present

## 2020-11-25 DIAGNOSIS — M25512 Pain in left shoulder: Secondary | ICD-10-CM

## 2020-11-25 DIAGNOSIS — M25562 Pain in left knee: Secondary | ICD-10-CM | POA: Diagnosis not present

## 2020-11-25 MED ORDER — LIDOCAINE HCL (PF) 1 % IJ SOLN
5.0000 mL | INTRAMUSCULAR | Status: AC | PRN
  Administered 2020-11-25: 5 mL

## 2020-11-25 MED ORDER — METHYLPREDNISOLONE ACETATE 40 MG/ML IJ SUSP
40.0000 mg | INTRAMUSCULAR | Status: AC | PRN
  Administered 2020-11-25: 40 mg via INTRA_ARTICULAR

## 2020-11-25 NOTE — Progress Notes (Signed)
Office Visit Note   Patient: Gail Page           Date of Birth: Apr 23, 1930           MRN: [id number] Visit Date: 11/25/2020              Requested by: Kirk Ruths, MD [facility] [place],  [place] [address] PCP: Kirk Ruths, MD  Chief Complaint  Patient presents with   Left Knee - Pain   Right Shoulder - Pain    S/p injection 1017/22      HPI: Patient is an 87 year old woman with severe osteoarthritis of the left knee with episodes of giving way.  Patient also has osteoarthritis of her right shoulder with difficulty with activities of daily living with abduction and flexion.  She states the right shoulder catches she complains of locking and giving way with the left knee.  Patient is just started home health physical therapy for her left knee and right shoulder.  Assessment & Plan: Visit Diagnoses:  1. Chronic pain of left knee   2. Unilateral primary osteoarthritis, left knee   3. Chronic pain of both shoulders     Plan: Left knee was injected.  Discussed that a brace would not provide her additional support for her left knee.  Discussed that her best option would be to proceed with a total knee arthroplasty.  Continue with strengthening for the right shoulder and strengthening of both lower extremities.  Follow-Up Instructions: Return if symptoms worsen or fail to improve.   Ortho Exam  Patient is alert, oriented, no adenopathy, well-dressed, normal affect, normal respiratory effort. Examination patient has crepitation with range of motion of the right shoulder pain with Neer and Hawkins impingement test.  Examination of the left knee she has valgus alignment she has crepitation with range of motion of the left knee she is tender to palpation over the medial and lateral joint line as well as the patellofemoral joint.  Collaterals and cruciates are stable.  Imaging: XR Knee 1-2 Views Left  Result Date: 11/25/2020 2  view radiographs of the left knee shows severe tricompartmental arthritis with bone-on-bone contact laterally with large osteophytic bone spurs and loose bodies with subchondral cysts and sclerosis.  No images are attached to the encounter.  Labs: Lab Results  Component Value Date   HGBA1C 5.1 08/31/2018   REPTSTATUS 09/05/2018 FINAL 09/01/2018   GRAMSTAIN  09/01/2018    WBC PRESENT, PREDOMINANTLY PMN NO ORGANISMS SEEN CYTOSPIN SMEAR    CULT  09/01/2018    NO GROWTH 3 DAYS Performed at [facility], [place] [address], [place], [place] [address]      Lab Results  Component Value Date   ALBUMIN 2.6 (L) 08/28/2018    Lab Results  Component Value Date   MG 1.9 08/28/2018   No results found for: VD25OH  No results found for: PREALBUMIN CBC EXTENDED Latest Ref Rng & Units 08/31/2018 08/29/2018 08/28/2018  WBC 4.0 - 10.5 K/uL 6.9 7.9 8.0  RBC 3.87 - 5.11 MIL/uL 2.81(L) 2.77(L) 3.19(L)  HGB 12.0 - 15.0 g/dL 8.5(L) 8.4(L) 9.7(L)  HCT 36.0 - 46.0 % 26.8(L) 26.5(L) 31.5(L)  PLT 150 - 400 K/uL 206 204 227  NEUTROABS 1.7 - 7.7 K/uL 4.0 - -  LYMPHSABS 0.7 - 4.0 K/uL 1.6 - -     There is no height or weight on file to calculate BMI.  Orders:  Orders Placed This Encounter  Procedures   XR Knee 1-2 Views Left   No orders of the defined types were placed in this encounter.    Procedures: Large Joint Inj: L knee on 11/25/2020 10:13 AM Indications: pain and diagnostic evaluation Details: 22 G 1.5 in needle, anteromedial approach  Arthrogram: No  Medications: 5 mL lidocaine (PF) 1 %; 40 mg methylPREDNISolone acetate 40 MG/ML Outcome: tolerated well, no immediate complications Procedure, treatment alternatives, risks and benefits explained, specific risks discussed. Consent was given by the patient. Immediately prior to procedure a time out was called to verify the correct patient, procedure, equipment, support staff and site/side marked as required. Patient was prepped and  draped in the usual sterile fashion.     Clinical Data: No additional findings.  ROS:  All other systems negative, except as noted in the HPI. Review of Systems  Objective: Vital Signs: There were no vitals taken for this visit.  Specialty Comments:  No specialty comments available.  PMFS History: Patient Active Problem List   Diagnosis Date Noted   Cellulitis of leg, right 08/28/2018   Diarrhea 08/28/2018   Opacity of lung on imaging study 08/28/2018   Anemia of chronic disease 08/28/2018   Chronic diastolic CHF (congestive heart failure) (HCC) 08/28/2018   Essential hypertension 08/28/2018   CAD (coronary artery disease) 08/28/2018   Diabetes mellitus type 2, diet-controlled (HCC) 08/28/2018   CKD (chronic kidney disease) stage 3, GFR 30-59 ml/min (HCC) 08/28/2018   Past Medical History:  Diagnosis Date   Anemia    CHF (congestive heart failure) (HCC)    Coronary artery disease    Diabetes mellitus without complication (HCC)    Lymphadenopathy    Neuropathy    Renal disorder    Stroke [facility])     History reviewed. No pertinent family history.  Past Surgical History:  Procedure Laterality Date   CHOLECYSTECTOMY     TONSILLECTOMY     TOTAL HIP ARTHROPLASTY     Social History   Occupational History   Occupation: retired  Tobacco Use   Smoking status: Never   Smokeless tobacco: Never  Substance and Sexual Activity   Alcohol use: Not Currently    Comment: rare   Drug use: Never   Sexual activity: Not on file

## 2021-11-03 ENCOUNTER — Ambulatory Visit (INDEPENDENT_AMBULATORY_CARE_PROVIDER_SITE_OTHER): Payer: Medicare Other | Admitting: Orthopedic Surgery

## 2021-11-03 DIAGNOSIS — M7541 Impingement syndrome of right shoulder: Secondary | ICD-10-CM

## 2021-11-03 DIAGNOSIS — M25512 Pain in left shoulder: Secondary | ICD-10-CM | POA: Diagnosis not present

## 2021-11-03 DIAGNOSIS — G8929 Other chronic pain: Secondary | ICD-10-CM

## 2021-11-03 DIAGNOSIS — M25511 Pain in right shoulder: Secondary | ICD-10-CM | POA: Diagnosis not present

## 2021-11-04 ENCOUNTER — Encounter: Payer: Self-pay | Admitting: Orthopedic Surgery

## 2021-11-04 DIAGNOSIS — M25512 Pain in left shoulder: Secondary | ICD-10-CM

## 2021-11-04 DIAGNOSIS — M25511 Pain in right shoulder: Secondary | ICD-10-CM

## 2021-11-04 DIAGNOSIS — G8929 Other chronic pain: Secondary | ICD-10-CM

## 2021-11-04 DIAGNOSIS — M7541 Impingement syndrome of right shoulder: Secondary | ICD-10-CM | POA: Diagnosis not present

## 2021-11-04 MED ORDER — LIDOCAINE HCL 1 % IJ SOLN
5.0000 mL | INTRAMUSCULAR | Status: AC | PRN
  Administered 2021-11-04: 5 mL

## 2021-11-04 MED ORDER — METHYLPREDNISOLONE ACETATE 40 MG/ML IJ SUSP
40.0000 mg | INTRAMUSCULAR | Status: AC | PRN
  Administered 2021-11-04: 40 mg via INTRA_ARTICULAR

## 2021-11-04 NOTE — Progress Notes (Signed)
Office Visit Note   Patient: Gail Page           Date of Birth: [birth year]           MRN: [id number] Visit Date: 11/03/2021              Requested by: Kirk Ruths, MD [place] [facility] [place],  [place] [address] PCP: Kirk Ruths, MD  Chief Complaint  Patient presents with   Right Shoulder - Pain      HPI: Patient is an 88 year old woman who presents with recurrent pain right shoulder.  Patient states she has pain with overhead activities she states her arm seems to give way.  She is status post right shoulder injection a year ago.  Assessment & Plan: Visit Diagnoses:  1. Chronic pain of both shoulders   2. Impingement syndrome of right shoulder     Plan: Patient underwent a subacromial injection.  She will follow-up in 4 weeks  Follow-Up Instructions: Return if symptoms worsen or fail to improve.   Ortho Exam  Patient is alert, oriented, no adenopathy, well-dressed, normal affect, normal respiratory effort. Examination patient has abduction and flexion to 90 degrees.  She has pain with Neer and Hawkins impingement test pain to palpation over the biceps tendon.  AC joint is not tender to palpation.  Imaging: No results found. No images are attached to the encounter.  Labs: Lab Results  Component Value Date   HGBA1C 5.1 08/31/2018   REPTSTATUS 09/05/2018 FINAL 09/01/2018   GRAMSTAIN  09/01/2018    WBC PRESENT, PREDOMINANTLY PMN NO ORGANISMS SEEN CYTOSPIN SMEAR    CULT  09/01/2018    NO GROWTH 3 DAYS Performed at [facility], [place] [address] [place], [place] [address]      Lab Results  Component Value Date   ALBUMIN 2.6 (L) 08/28/2018    Lab Results  Component Value Date   MG 1.9 08/28/2018   No results found for: "VD25OH"  No results found for: "PREALBUMIN"    Latest Ref Rng & Units 08/31/2018    2:49 AM 08/29/2018    2:45 AM 08/28/2018    8:30 AM  CBC EXTENDED  WBC 4.0 - 10.5 K/uL 6.9  7.9  8.0    RBC 3.87 - 5.11 MIL/uL 2.81  2.77  3.19   Hemoglobin 12.0 - 15.0 g/dL 8.5  8.4  9.7   HCT 36.0 - 46.0 % 26.8  26.5  31.5   Platelets 150 - 400 K/uL 206  204  227   NEUT# 1.7 - 7.7 K/uL 4.0     Lymph# 0.7 - 4.0 K/uL 1.6        There is no height or weight on file to calculate BMI.  Orders:  No orders of the defined types were placed in this encounter.  No orders of the defined types were placed in this encounter.    Procedures: Large Joint Inj: R subacromial bursa on 11/04/2021 7:14 AM Indications: diagnostic evaluation and pain Details: 22 G 1.5 in needle  Arthrogram: No  Medications: 5 mL lidocaine 1 %; 40 mg methylPREDNISolone acetate 40 MG/ML Outcome: tolerated well, no immediate complications Procedure, treatment alternatives, risks and benefits explained, specific risks discussed. Consent was given by the patient. Immediately prior to procedure a time out was called to verify the correct patient, procedure, equipment, support staff and site/side marked as required. Patient was prepped and draped in the usual sterile fashion.  Clinical Data: No additional findings.  ROS:  All other systems negative, except as noted in the HPI. Review of Systems  Objective: Vital Signs: There were no vitals taken for this visit.  Specialty Comments:  No specialty comments available.  PMFS History: Patient Active Problem List   Diagnosis Date Noted   Cellulitis of leg, right 08/28/2018   Diarrhea 08/28/2018   Opacity of lung on imaging study 08/28/2018   Anemia of chronic disease 08/28/2018   Chronic diastolic CHF (congestive heart failure) ([place]) 08/28/2018   Essential hypertension 08/28/2018   CAD (coronary artery disease) 08/28/2018   Diabetes mellitus type 2, diet-controlled ([place]) 08/28/2018   CKD (chronic kidney disease) stage 3, GFR 30-59 ml/min ([place]) 08/28/2018   Past Medical History:  Diagnosis Date   Anemia    CHF (congestive heart failure) (HCC)     Coronary artery disease    Diabetes mellitus without complication ([place])    Lymphadenopathy    Neuropathy    Renal disorder    Stroke [facility])     History reviewed. No pertinent family history.  Past Surgical History:  Procedure Laterality Date   CHOLECYSTECTOMY     TONSILLECTOMY     TOTAL HIP ARTHROPLASTY     Social History   Occupational History   Occupation: retired  Tobacco Use   Smoking status: Never   Smokeless tobacco: Never  Substance and Sexual Activity   Alcohol use: Not Currently    Comment: rare   Drug use: Never   Sexual activity: Not on file

## 2021-12-01 ENCOUNTER — Ambulatory Visit (INDEPENDENT_AMBULATORY_CARE_PROVIDER_SITE_OTHER): Payer: Medicare Other | Admitting: Orthopedic Surgery

## 2021-12-01 ENCOUNTER — Encounter: Payer: Self-pay | Admitting: Orthopedic Surgery

## 2021-12-01 DIAGNOSIS — M7541 Impingement syndrome of right shoulder: Secondary | ICD-10-CM

## 2021-12-01 NOTE — Progress Notes (Signed)
Office Visit Note   Patient: Gail Page           Date of Birth: Mar 15, 1930           MRN: [id number] Visit Date: 12/01/2021              Requested by: Kirk Ruths, MD [place] [facility] [place],  [place] [address] PCP: Kirk Ruths, MD  Chief Complaint  Patient presents with   Right Shoulder - Follow-up    S/p injection 11/03/2021      HPI: Patient is an 88 year old woman who presents 4 weeks status post right shoulder subacromial injection.  She states she does feel better.  Patient states the spasms in her arm have resolved.  Assessment & Plan: Visit Diagnoses:  1. Impingement syndrome of right shoulder     Plan: Continue with activities as tolerated follow-up as needed for repeat injection.  Follow-Up Instructions: No follow-ups on file.   Ortho Exam  Patient is alert, oriented, no adenopathy, well-dressed, normal affect, normal respiratory effort. Examination patient has good active range of motion of the right shoulder.  She has minimal pain with internal and external rotation.  Imaging: No results found. No images are attached to the encounter.  Labs: Lab Results  Component Value Date   HGBA1C 5.1 08/31/2018   REPTSTATUS 09/05/2018 FINAL 09/01/2018   GRAMSTAIN  09/01/2018    WBC PRESENT, PREDOMINANTLY PMN NO ORGANISMS SEEN CYTOSPIN SMEAR    CULT  09/01/2018    NO GROWTH 3 DAYS Performed at [facility], [place] [address], [place], [place] [address]      Lab Results  Component Value Date   ALBUMIN 2.6 (L) 08/28/2018    Lab Results  Component Value Date   MG 1.9 08/28/2018   No results found for: "VD25OH"  No results found for: "PREALBUMIN"    Latest Ref Rng & Units 08/31/2018    2:49 AM 08/29/2018    2:45 AM 08/28/2018    8:30 AM  CBC EXTENDED  WBC 4.0 - 10.5 K/uL 6.9  7.9  8.0   RBC 3.87 - 5.11 MIL/uL 2.81  2.77  3.19   Hemoglobin 12.0 - 15.0 g/dL 8.5  8.4  9.7   HCT 36.0 - 46.0 % 26.8  26.5   31.5   Platelets 150 - 400 K/uL 206  204  227   NEUT# 1.7 - 7.7 K/uL 4.0     Lymph# 0.7 - 4.0 K/uL 1.6        There is no height or weight on file to calculate BMI.  Orders:  No orders of the defined types were placed in this encounter.  No orders of the defined types were placed in this encounter.    Procedures: No procedures performed  Clinical Data: No additional findings.  ROS:  All other systems negative, except as noted in the HPI. Review of Systems  Objective: Vital Signs: There were no vitals taken for this visit.  Specialty Comments:  No specialty comments available.  PMFS History: Patient Active Problem List   Diagnosis Date Noted   Cellulitis of leg, right 08/28/2018   Diarrhea 08/28/2018   Opacity of lung on imaging study 08/28/2018   Anemia of chronic disease 08/28/2018   Chronic diastolic CHF (congestive heart failure) ([place]) 08/28/2018   Essential hypertension 08/28/2018   CAD (coronary artery disease) 08/28/2018   Diabetes mellitus type 2, diet-controlled ([place]) 08/28/2018   CKD (chronic kidney  disease) stage 3, GFR 30-59 ml/min (HCC) 08/28/2018   Past Medical History:  Diagnosis Date   Anemia    CHF (congestive heart failure) (HCC)    Coronary artery disease    Diabetes mellitus without complication (HCC)    Lymphadenopathy    Neuropathy    Renal disorder    Stroke [facility])     History reviewed. No pertinent family history.  Past Surgical History:  Procedure Laterality Date   CHOLECYSTECTOMY     TONSILLECTOMY     TOTAL HIP ARTHROPLASTY     Social History   Occupational History   Occupation: retired  Tobacco Use   Smoking status: Never   Smokeless tobacco: Never  Substance and Sexual Activity   Alcohol use: Not Currently    Comment: rare   Drug use: Never   Sexual activity: Not on file
